# Patient Record
Sex: Female | Born: 1995
Health system: Southern US, Community
[De-identification: ages and names within clinical notes are randomized; demographics above are authoritative.]

## PROBLEM LIST (undated history)

## (undated) ENCOUNTER — Ambulatory Visit: Payer: 59

## (undated) DIAGNOSIS — J45909 Unspecified asthma, uncomplicated: Secondary | ICD-10-CM

## (undated) DIAGNOSIS — R413 Other amnesia: Secondary | ICD-10-CM

## (undated) DIAGNOSIS — R569 Unspecified convulsions: Secondary | ICD-10-CM

## (undated) DIAGNOSIS — I609 Nontraumatic subarachnoid hemorrhage, unspecified: Secondary | ICD-10-CM

## (undated) DIAGNOSIS — I639 Cerebral infarction, unspecified: Secondary | ICD-10-CM

## (undated) DIAGNOSIS — H479 Unspecified disorder of visual pathways: Secondary | ICD-10-CM

## (undated) DIAGNOSIS — L309 Dermatitis, unspecified: Secondary | ICD-10-CM

## (undated) DIAGNOSIS — T7840XA Allergy, unspecified, initial encounter: Secondary | ICD-10-CM

## (undated) HISTORY — PX: STRABISMUS SURGERY: SHX218

## (undated) HISTORY — DX: Allergy, unspecified, initial encounter: T78.40XA

## (undated) HISTORY — PX: EYE SURGERY: SHX253

## (undated) HISTORY — DX: Dermatitis, unspecified: L30.9

## (undated) HISTORY — DX: Other amnesia: R41.3

## (undated) HISTORY — DX: Unspecified asthma, uncomplicated: J45.909

---
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
## 2017-04-10 DIAGNOSIS — H5213 Myopia, bilateral: Secondary | ICD-10-CM | POA: Diagnosis not present

## 2017-04-10 DIAGNOSIS — H538 Other visual disturbances: Secondary | ICD-10-CM | POA: Diagnosis not present

## 2017-10-10 ENCOUNTER — Other Ambulatory Visit: Payer: Self-pay

## 2017-10-10 MED ORDER — LEVOCETIRIZINE DIHYDROCHLORIDE 5 MG PO TABS: 5.0000 mg | ORAL_TABLET | Freq: Every evening | ORAL | 0 refills | Status: DC

## 2017-10-10 NOTE — Telephone Encounter (Signed)
Rx for Xyzal 5 mg sent into pharmacy. Patient needs to schedule an appointment to see PCP for further refills

## 2017-10-13 ENCOUNTER — Telehealth: Payer: Self-pay | Admitting: Family Medicine

## 2017-10-13 NOTE — Telephone Encounter (Signed)
15 days was sent into the pharmacy on 10/10/2017, patient's appointment is within this time frame further refills will be addressed at the time of appointment. MPulliam, CMA/RT(R)

## 2017-10-13 NOTE — Telephone Encounter (Signed)
Patient called pharmacy for refill but was told OV required before any refills granted--scheduled pt for 1/4 @ 10:30.  Pt request refill on: levocetirizine (XYZAL) 5 MG tablet [[id number]][[id number]]  Order Details  Dose: 5 mg Route: Oral Frequency: Every evening  Note to Pharmacy:  Needs OV for further refills      Dispense Quantity: 15 tablet Refills: 0 Fills remaining: --        Sig: Take 1 tablet (5 mg total) by mouth every evening.     Patient uses: [facility] [place] - [id number] [address] DEA #:     --[id number]

## 2017-10-17 ENCOUNTER — Ambulatory Visit: Payer: 59 | Admitting: Family Medicine

## 2017-10-17 ENCOUNTER — Encounter: Payer: Self-pay | Admitting: Family Medicine

## 2017-10-17 VITALS — BP 127/88 | HR 97 | Ht 62.5 in | Wt 98.9 lb

## 2017-10-17 DIAGNOSIS — N946 Dysmenorrhea, unspecified: Secondary | ICD-10-CM | POA: Insufficient documentation

## 2017-10-17 DIAGNOSIS — J3089 Other allergic rhinitis: Secondary | ICD-10-CM

## 2017-10-17 DIAGNOSIS — Z833 Family history of diabetes mellitus: Secondary | ICD-10-CM | POA: Diagnosis not present

## 2017-10-17 DIAGNOSIS — J45909 Unspecified asthma, uncomplicated: Secondary | ICD-10-CM | POA: Insufficient documentation

## 2017-10-17 DIAGNOSIS — N926 Irregular menstruation, unspecified: Secondary | ICD-10-CM | POA: Insufficient documentation

## 2017-10-17 MED ORDER — FLUTICASONE PROPIONATE 50 MCG/ACT NA SUSP: 1.0000 | Freq: Every day | NASAL | 11 refills | Status: DC

## 2017-10-17 MED ORDER — BUDESONIDE 180 MCG/ACT IN AEPB: 1.0000 | INHALATION_SPRAY | Freq: Every day | RESPIRATORY_TRACT | 11 refills | Status: DC

## 2017-10-17 MED ORDER — LEVOCETIRIZINE DIHYDROCHLORIDE 5 MG PO TABS: 5.0000 mg | ORAL_TABLET | Freq: Every evening | ORAL | 11 refills | Status: DC

## 2017-10-17 NOTE — Progress Notes (Signed)
Impression and Recommendations:    1.  Seasonal and environmental allergies   2. Reactive airway disease without complication, unspecified asthma severity, unspecified whether persistent   3. Dysmenorrhea in adolescent   4. Family history of diabetes mellitus (DM)     1. Allergies/Sinusitis - Recommended use of neti-pot, Neilmed, or AYR sinus rinse using distilled water, followed by Flonase, one spray per each nostril. Repeat sinus rinse twice daily.  - Patient was advised to flush sinuses immediately after exposure to known allergen triggers.  - Refill pulmicort, flonase, xyzal.  -Patient denies need for rescue inhaler.  Denies any symptoms.  Stable.  2. Dysmenorrhea - Patient was advised to continue following up with OBGYN provider for evaluation of her dysmenorrhea. - She obtains her birth control pill prescription from her OBGYN.  The importance of routine follow up examination was emphasized and discussed with the patient.  She should come in within 6 months for fasting labs and a complete physical.   Meds ordered this encounter  Medications  . budesonide (PULMICORT) 180 MCG/ACT inhaler    Sig: Inhale 1 puff into the lungs daily.    Dispense:  1 Inhaler    Refill:  11  . fluticasone (FLONASE) 50 MCG/ACT nasal spray    Sig: Place 1 spray into both nostrils daily.    Dispense:  16 g    Refill:  11  . levocetirizine (XYZAL) 5 MG tablet    Sig: Take 1 tablet (5 mg total) by mouth every evening.    Dispense:  30 tablet    Refill:  11    Needs OV for further refills    Gross side effects, risk and benefits, and alternatives of medications and treatment plan in general discussed with patient.  Patient is aware that all medications have potential side effects and we are unable to predict every side effect or drug-drug interaction that may occur.   Patient will call with any questions prior to using medication if they have concerns.  Expresses verbal understanding  and consents to current therapy and treatment regimen.  No barriers to understanding were identified.  Red flag symptoms and signs discussed in detail.  Patient expressed understanding regarding what to do in case of emergency\urgent symptoms  Please see AVS handed out to patient at the end of our visit for further patient instructions/ counseling done pertaining to today's office visit.   Return for 82mo for physical and fasting bldwrk; sooner as needed.    Note: This note was prepared with assistance of Dragon voice recognition software. Occasional wrong-word or sound-a-like substitutions may have occurred due to the inherent limitations of voice recognition software.   This document serves as a record of services personally performed by Thomasene Lot, DO. It was created on her behalf by Peggye Fothergill, a trained medical scribe. The creation of this record is based on the scribe's personal observations and the provider's statements to them.   I have reviewed medical documentation per the transcriptionist for accuracy and concur.  Thomasene Lot 10/17/17 11:10 AM   --------------------------------------------------------------------------------------------------------------------------------------------------------------------------------------------------------------------------------------------    Subjective:     HPI: Kathleen Myers is a 22 y.o. female who presents to [facility] at [facility] today for issues as discussed below.  She returns today unaccompanied. She is still working as a Art gallery manager and attending classes at [employer], and has aspirations to continue working in daycare.  Allergy Medication Evaluation - She is here today to  evaluate her current prescriptions. She is overdue for her routine physical examination.  - Her allergies have been fine. She is still taking Pulmicort. With her allergies, she has good days and bad  days, sometimes even while taking her medicine. She notes that these bad days are worse whenever the weather changes. She denies issues with breathing, SOB, SOB on exertion, and does not need a rescue inhaler at this time.  Dysmenorrhea - She takes pain medicine PRN for pain during her menstrual cycle.  These are given to her by her GYN  - She is currently on a birth control pill prescribed by her OBGYN provider.  Family Hx - She notes that her grandmother has diabetes. For the rest of her family history, she is unaware, but will check on this.  - She does not recall having labs drawn in the past.   Wt Readings from Last 3 Encounters:  10/17/17 98 lb 14.4 oz (44.9 kg) (3 %, Z= -1.92)*  09/18/16 98 lb 12.8 oz (44.8 kg) (3 %, Z= -1.86)*  02/12/16 100 lb (45.4 kg) (5 %, Z= -1.67)*   * Growth percentiles are based on CDC (Girls, 2-20 Years) data.   BP Readings from Last 3 Encounters:  10/17/17 127/88 (93 %, Z = 1.48 /  >99 %, Z > 2.33)*  09/18/16 124/88 (89 %, Z = 1.22 /  >99 %, Z > 2.33)*  02/12/16 111/73 (54 %, Z = 0.09 /  81 %, Z = 0.87)*   *BP percentiles are based on the August 2017 AAP Clinical Practice Guideline for girls   Pulse Readings from Last 3 Encounters:  10/17/17 97  09/18/16 95  02/12/16 (!) 58   BMI Readings from Last 3 Encounters:  10/17/17 17.80 kg/m (5 %, Z= -1.66)*  09/18/16 17.78 kg/m (5 %, Z= -1.61)*  02/12/16 18.14 kg/m (9 %, Z= -1.33)*   * Growth percentiles are based on CDC (Girls, 2-20 Years) data.     Patient Care Team    Relationship Specialty Notifications Start End  Thomasene Lotpalski, Ziana Heyliger, DO PCP - General Family Medicine  09/18/16   Eldridge AbrahamsPizoli, Carolyn Elizabeth, MD Referring Physician Pediatric Neurology  09/18/16    Comment: 6 yrs ago  Eileen StanfordWhelan, Meg, MD Referring Physician Allergy and Immunology  09/18/16   Key, Verita SchneidersEvelyn M, NP Nurse Practitioner Gynecology  09/18/16   Verne CarrowYoung, William, MD Consulting Physician Ophthalmology  09/18/16    Comment: still  actively seeing him     Patient Active Problem List   Diagnosis Date Noted  . Dysmenorrhea in adolescent 10/17/2017    Priority: High  . Family history of diabetes mellitus (DM) 10/17/2017    Priority: High  . Oral contraceptive use- seen by GYN Darlyn Chamber- Evelyn Key, NP 10/08/2016    Priority: High  .  Seasonal and environmental allergies-txed by Dr Barnetta ChapelWhelan 09/18/2016    Priority: High  . Borderline intellectual disability 12/05/2011    Priority: High  . 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 Seizure (HCC) 09/18/2016    Priority: Medium  . h/o Stroke secondary to vomiting episodes per Neurologist 09/18/2016    Priority: Medium  . h/o SAH (subarachnoid hemorrhage) at birth 09/18/2016    Priority: Medium  . Poor short term memory 09/18/2016    Priority: Medium  . Abnormal brain MRI 12/05/2011    Priority: Low  . Irregular periods 10/17/2017  . Prolonged periods 10/17/2017  . Reactive airway disease without complication 10/17/2017  .  Upper respiratory symptom 09/18/2016  . Neonatal seizures 12/05/2011  . Strabismus 12/05/2011    Past Medical history, Surgical history, Family history, Social history, Allergies and Medications have been entered into the medical record, reviewed and changed as needed.    Current Meds  Medication Sig  . budesonide (PULMICORT) 180 MCG/ACT inhaler Inhale 1 puff into the lungs daily.  Marland Kitchen desogestrel-ethinyl estradiol (APRI,EMOQUETTE,SOLIA) 0.15-30 MG-MCG tablet Take 1 tablet by mouth daily.  . fluticasone (FLONASE) 50 MCG/ACT nasal spray Place 1 spray into both nostrils daily.  Marland Kitchen levocetirizine (XYZAL) 5 MG tablet Take 1 tablet (5 mg total) by mouth every evening.  . [DISCONTINUED] budesonide (PULMICORT) 180 MCG/ACT inhaler Inhale 1 puff into the lungs daily.  . [DISCONTINUED] fluticasone (FLONASE) 50 MCG/ACT nasal spray Place 1 spray into both nostrils daily.  . [DISCONTINUED] levocetirizine (XYZAL) 5 MG tablet Take 1 tablet  (5 mg total) by mouth every evening.  . [DISCONTINUED] norethindrone-ethinyl estradiol (JUNEL FE,GILDESS FE,LOESTRIN FE) 1-20 MG-MCG tablet Take 1 tablet by mouth every evening.    Allergies:  Allergies  Allergen Reactions  . Other Other (See Comments)    anaphalaxis with peanuts  . Peanut-Containing Drug Products Anaphylaxis  . Celery Oil Other (See Comments)    Review of Systems:  A fourteen system review of systems was performed and found to be positive as per HPI.   Objective:   Blood pressure 127/88, pulse 97, height 5' 2.5" (1.588 m), weight 98 lb 14.4 oz (44.9 kg), last menstrual period 10/03/2017, SpO2 100 %. Body mass index is 17.8 kg/m. General:  Well Developed, well nourished, appropriate for stated age.  Neuro:  Alert and oriented,  extra-ocular muscles intact  HEENT:  Normocephalic, atraumatic, neck supple, no carotid bruits appreciated  Skin:  no gross rash, warm, pink. Cardiac:  RRR, S1 S2 Respiratory:  ECTA B/L and A/P, Not using accessory muscles, speaking in full sentences- unlabored. Vascular:  Ext warm, no cyanosis apprec.; cap RF less 2 sec. Psych:  No HI/SI, judgement and insight good, Euthymic mood. Full Affect.

## 2017-10-17 NOTE — Patient Instructions (Addendum)
In addition to your allergy medicines I recommend sinus rinses twice daily such as Neomed or AYR sinus rinses.  Please make sure you use previously boiled or distilled water.  Do sinus rinses twice daily followed by 1 spray 1 spray Flonase each nostril.  Please see the video below on how to use.  And also make sure that you do it after any prolonged exposure to the elements that you know tend to aggravate your allergies  [email or web address]      Preventive Care for Adults  A healthy lifestyle and preventive care can promote health and wellness. Preventive health guidelines for men include the following key practices:  .   A routine yearly physical is a good way to check with your health care provider about your health and preventative screening. It is a chance to share any concerns and updates on your health and to receive a thorough exam.  .  Visit your dentist for a routine exam and preventative care every 6 months. Brush your teeth twice a day and floss once a day. Good oral hygiene prevents tooth decay and gum disease.  .  The frequency of eye exams is based on your age, health, family medical history, use of contact lenses, and other factors.  Follow your health care provider's recommendations for frequency of eye exams.  .  Eat a healthy diet.  Foods such as vegetables, fruits, whole grains, low-fat dairy products, and lean protein foods contain the nutrients you need without too many calories.  Decrease your intake of foods high in solid fats, added sugars, and salt.  Eat the right amount of calories for you.  Get information about a proper diet from your health care provider, if necessary.  .  Regular physical exercise is one of the most important things you can do for your health.  Most adults should get at least 150 minutes of moderate-intensity exercise (any activity that increases your heart rate and causes you to sweat) each week.  In addition, most  adults need muscle-strengthening exercises on 2 or more days a week.  .  Maintain a healthy weight. The body mass index (BMI) is a screening tool to identify possible weight problems. It provides an estimate of body fat based on height and weight. Your health care provider can find your BMI and can help you achieve or maintain a healthy weight. For adults 20 years and older: A BMI below 18.5 is considered underweight. A BMI of 18.5 to 24.9 is normal. A BMI of 25 to 29.9 is considered overweight. A BMI of 30 and above is considered obese.  .  Maintain normal blood lipids and cholesterol levels by exercising and minimizing your intake of saturated fat. Eat a balanced diet with plenty of fruit and vegetables. Blood tests for lipids and cholesterol should begin at age 58 and be repeated every 5 years. If your lipid or cholesterol levels are high, you are over 50, or you are at high risk for heart disease, you may need your cholesterol levels checked more frequently. Ongoing high lipid and cholesterol levels should be treated with medicines if diet and exercise are not working.  .  If you smoke, find out from your health care provider how to quit. If you do not use tobacco, do not start.  . If you choose to drink alcohol, do not have more than 2 drinks per day. One drink is considered to be 12 ounces (355 mL) of beer, 5 ounces (148 mL) of  wine, or 1.5 ounces (44 mL) of liquor.  Marland Kitchen. Avoid use of street drugs. Do not share needles with anyone. Ask for help if you need support or instructions about stopping the use of drugs.  . High blood pressure causes heart disease and increases the risk of stroke. Your blood pressure should be checked at least every 1-2 years. Ongoing high blood pressure should be treated with medicines, if weight loss and exercise are not effective.  . If you are 4645-20 years old, ask your health care provider if you should take aspirin to prevent heart disease.  . Diabetes  screening involves taking a blood sample to check your fasting blood sugar level.  This should be done once every 3 years, after age 20, if you are within normal weight and without risk factors for diabetes.  Testing should be considered at a younger age or be carried out more frequently if you are overweight and have at least 1 risk factor for diabetes.  . Colorectal cancer can be detected and often prevented. Most routine colorectal cancer screening begins at the age of 20 and continues through age 20. However, your health care provider may recommend screening at an earlier age if you have risk factors for colon cancer. On a yearly basis, your health care provider may provide home test kits to check for hidden blood in the stool. Use of a small camera at the end of a tube to directly examine the colon (sigmoidoscopy or colonoscopy) can detect the earliest forms of colorectal cancer. Talk to your health care provider about this at age 20, when routine screening begins. Direct exam of the colon should be repeated every 5-10 years through age 20, unless early forms of precancerous polyps or small growths are found.  .  Lung cancer screening is recommended for adults aged 55-80 years who are at high risk for developing lung cancer because of a history of smoking. A yearly low-dose CT scan of the lungs is recommended for people who have at least a 30-pack-year history of smoking and are a current smoker or have quit within the past 15 years. A pack year of smoking is smoking an average of 1 pack of cigarettes a day for 1 year (for example: 1 pack a day for 30 years or 2 packs a day for 15 years). Yearly screening should continue until the smoker has stopped smoking for at least 15 years. Yearly screening should be stopped for people who develop a health problem that would prevent them from having lung cancer treatment.  . Talk with your health care provider about prostate cancer screening.  . Testicular  cancer screening is recommended for adult males. Screening includes self-exam and a health care provider exam. Consult with your health care provider about any symptoms you have or any concerns you have about testicular cancer.  . Use sunscreen. Apply sunscreen liberally and repeatedly throughout the day. You should seek shade when your shadow is shorter than you. Protect yourself by wearing long sleeves, pants, a wide-brimmed hat, and sunglasses year round, whenever you are outdoors.  . Once a month, do a whole-body skin exam, using a mirror to look at the skin on your back. Tell your health care provider about new moles, moles that have irregular borders, moles that are larger than a pencil eraser, or moles that have changed in shape or color.    ++++++++++++++++++++++++++++++++++++++++++++++++++++++++++++++++++  Stay current with required vaccines (immunizations).  ? Influenza vaccine. All adults should be immunized  every year.  ? Tetanus, diphtheria, and acellular pertussis (Td, Tdap) vaccine. An adult who has not previously received Tdap or who does not know his vaccine status should receive 1 dose of Tdap. This initial dose should be followed by tetanus and diphtheria toxoids (Td) booster doses every 10 years. Adults with an unknown or incomplete history of completing a 3-dose immunization series with Td-containing vaccines should begin or complete a primary immunization series including a Tdap dose. Adults should receive a Td booster every 10 years.  ? Varicella vaccine. An adult without evidence of immunity to varicella should receive 2 doses or a second dose if he has previously received 1 dose.  ? Human papillomavirus (HPV) vaccine. Males aged 13-21 years who have not received the vaccine previously should receive the 3-dose series. Males aged 22-26 years may be immunized. Immunization is recommended through the age of 59 years for any female who has sex with males and did not get any  or all doses earlier. Immunization is recommended for any person with an immunocompromised condition through the age of 26 years if he did not get any or all doses earlier. During the 3-dose series, the second dose should be obtained 4-8 weeks after the first dose. The third dose should be obtained 24 weeks after the first dose and 16 weeks after the second dose.  ? Zoster vaccine. One dose is recommended for adults aged 62 years or older unless certain conditions are present.   ? PREVNAR - Pneumococcal 13-valent conjugate (PCV13) vaccine. When indicated, a person who is uncertain of his immunization history and has no record of immunization should receive the PCV13 vaccine. An adult aged 74 years or older who has certain medical conditions and has not been previously immunized should receive 1 dose of PCV13 vaccine. This PCV13 should be followed with a dose of pneumococcal polysaccharide (PPSV23) vaccine. The PPSV23 vaccine dose should be obtained at least 8 weeks after the dose of PCV13 vaccine. An adult aged 29 years or older who has certain medical conditions and previously received 1 or more doses of PPSV23 vaccine should receive 1 dose of PCV13. The PCV13 vaccine dose should be obtained 1 or more years after the last PPSV23 vaccine dose.   ? PNEUMOVAX - Pneumococcal polysaccharide (PPSV23) vaccine. When PCV13 is also indicated, PCV13 should be obtained first. All adults aged 29 years and older should be immunized. An adult younger than age 52 years who has certain medical conditions should be immunized. Any person who resides in a nursing home or long-term care facility should be immunized. An adult smoker should be immunized. People with an immunocompromised condition and certain other conditions should receive both PCV13 and PPSV23 vaccines. People with human immunodeficiency virus (HIV) infection should be immunized as soon as possible after diagnosis. Immunization during chemotherapy or  radiation therapy should be avoided. Routine use of PPSV23 vaccine is not recommended for American Indians, [address], or people younger than 65 years unless there are medical conditions that require PPSV23 vaccine. When indicated, people who have unknown immunization and have no record of immunization should receive PPSV23 vaccine. One-time revaccination 5 years after the first dose of PPSV23 is recommended for people aged 19-64 years who have chronic kidney failure, nephrotic syndrome, asplenia, or immunocompromised conditions. People who received 1-2 doses of PPSV23 before age 59 years should receive another dose of PPSV23 vaccine at age 83 years or later if at least 5 years have passed since the previous dose. Doses  of PPSV23 are not needed for people immunized with PPSV23 at or after age 61 years.   ? Hepatitis A vaccine. Adults who wish to be protected from this disease, have certain high-risk conditions, work with hepatitis A-infected animals, work in hepatitis A research labs, or travel to or work in countries with a high rate of hepatitis A should be immunized. Adults who were previously unvaccinated and who anticipate close contact with an international adoptee during the first 60 days after arrival in the [place] from a country with a high rate of hepatitis A should be immunized.  ? Hepatitis B vaccine. Adults should be immunized if they wish to be protected from this disease, have certain high-risk conditions, may be exposed to blood or other infectious body fluids, are household contacts or sex partners of hepatitis B positive people, are clients or workers in certain care facilities, or travel to or work in countries with a high rate of hepatitis B.     Preventive Service / Frequency  . Ages 41 to 11  Blood pressure check.  Lipid and cholesterol check  Lung cancer screening. / Every year if you are aged 55-80 years and have a 30-pack-year history of smoking and currently  smoke or have quit within the past 15 years. Yearly screening is stopped once you have quit smoking for at least 15 years or develop a health problem that would prevent you from having lung cancer treatment.  Fecal occult blood test (FOBT) of stool. / Every year beginning at age 60 and continuing until age 61. You may not have to do this test if you get a colonoscopy every 10 years.  Flexible sigmoidoscopy** or colonoscopy.** / Every 5 years for a flexible sigmoidoscopy or every 10 years for a colonoscopy beginning at age 41 and continuing until age 31. Screening for abdominal aortic aneurysm (AAA) by ultrasound is recommended for people who have history of high blood pressure or who are current or former smokers.  ++++++++++++++++++++++++++++++++++++++++++++++++++++++++++++++  Recommend Adult Low Dose Aspirin or coated Aspirin 81 mg daily To reduce risk of Colon Cancer 20 % Skin Cancer 26 %  Melanoma 46% and Pancreatic cancer 60%  +++++++++++++++++++++++++++++++++++++++++++++++++++++++++++++  Vitamin D goal is between 50-100. Please make sure that you are taking your Vitamin D as directed.  It is very important as a natural anti-inflammatory - helping with muscle and joint aches; as well as helping hair, skin, and nails; as well as reducing stroke, heart attack and cancer risk. It helps your bones and helps with mood. It also decreases numerous cancer risks so please take it as directed.  - Low Vit D is associated with a 200-300% higher risk for CANCER and 200-300% higher risk for HEART ATTACK & STROKE.  It is also associated with higher death rate at younger ages, autoimmune diseases like Rheumatoid arthritis, Lupus, Multiple Sclerosis; also many other serious conditions, like depression, Alzheimer's Dementia, infertility, muscle aches, fatigue, fibromyalgia - just to name a few.  +++++++++++++++++++++++++++++++++++++++++++++++++++++++++++  Recommend the book "The END of DIETING" by  Dr Monico Hoar & the book "The END of DIABETES " by Dr Monico Hoar At [email or web address] - get book & Audio CD's   --->Being diabetic has a 300% increased risk for heart attack, stroke, cancer, and alzheimer- type vascular dementia. It is very important that you work harder with diet by avoiding all foods that are white. Avoid white rice (brown & wild rice is OK), white potatoes (sweet potatoes in moderation is OK), White  bread or wheat bread or anything made out of white flour like bagels, donuts, rolls, buns, biscuits, cakes, pastries, cookies, pizza crust, and pasta (made from white flour & egg whites) - vegetarian pasta or spinach or wheat pasta is OK. Multigrain breads like Arnold's or [employer], or multigrain sandwich thins or flatbreads. Diet, exercise and weight loss can reverse and cure diabetes in the early stages. Diet, exercise and weight loss is very important in the control and prevention of complications of diabetes which affects every system in your body, ie. Brain - dementia/stroke, eyes - glaucoma/blindness, heart - heart attack/heart failure, kidneys - dialysis, stomach - gastric paralysis, intestines - malabsorption, nerves - severe painful neuritis, circulation - gangrene & loss of a leg(s), and finally cancer and Alzheimers.  I recommend avoid fried & greasy foods, sweets/candy, white rice (brown or wild rice or Quinoa is OK), white potatoes (sweet potatoes are OK) - anything made from white flour - bagels, doughnuts, rolls, buns, biscuits,white and wheat breads, pizza crust and traditional pasta made of white flour & egg white(vegetarian pasta or spinach or wheat pasta is OK). Multi-grain bread is OK - like multi-grain flat bread or sandwich thins. Avoid alcohol in excess.  Exercise is also important. Eat all the vegetables you want - avoid fatty meats, especially red meat and dairy - especially cheese. Cheese is the most concentrated form of trans-fats which is the worst thing to clog  up our arteries. Veggie cheese is OK which can be found in the fresh produce section at [employer] or [employer] or [employer].  ++++++++++++++++++++++ DASH Eating Plan  DASH stands for "Dietary Approaches to Stop Hypertension."  The DASH eating plan is a healthy eating plan that has been shown to reduce high blood pressure (hypertension). Additional health benefits may include reducing the risk of type 2 diabetes mellitus, heart disease, and stroke. The DASH eating plan may also help with weight loss.  WHAT DO I NEED TO KNOW ABOUT THE DASH EATING PLAN? For the DASH eating plan, you will follow these general guidelines: . Choose foods with a percent daily value for sodium of less than 5% (as listed on the food label). . Use salt-free seasonings or herbs instead of table salt or sea salt. . Check with your health care provider or pharmacist before using salt substitutes. . Eat lower-sodium products, often labeled as "lower sodium" or "no salt added." . Eat fresh foods. . Eat more vegetables, fruits, and low-fat dairy products. . Choose whole grains. Look for the word "whole" as the first word in the ingredient list. . Choose fish . Limit sweets, desserts, sugars, and sugary drinks. . Choose heart-healthy fats. . Eat veggie cheese . Eat more home-cooked food and less restaurant, buffet, and fast food. . Limit fried foods. Adriana Simas foods using methods other than frying. . Limit canned vegetables. If you do use them, rinse them well to decrease the sodium. . When eating at a restaurant, ask that your food be prepared with less salt, or no salt if possible.   WHAT FOODS CAN I EAT? Read Dr Francis Dowse Fuhrman's books on The End of Dieting & The End of Diabetes  Grains Whole grain or whole wheat bread. Brown rice. Whole grain or whole wheat pasta. Quinoa, bulgur, and whole grain cereals. Low-sodium cereals. Corn or whole wheat flour tortillas. Whole grain cornbread. Whole grain  crackers. Low-sodium crackers.  Vegetables Fresh or frozen vegetables (raw, steamed, roasted, or grilled). Low-sodium or reduced-sodium tomato and vegetable  juices. Low-sodium or reduced-sodium tomato sauce and paste. Low-sodium or reduced-sodium canned vegetables.  Fruits All fresh, canned (in natural juice), or frozen fruits.  Protein Products All fish and seafood. Dried beans, peas, or lentils. Unsalted nuts and seeds. Unsalted canned beans.  Dairy Low-fat dairy products, such as skim or 1% milk, 2% or reduced-fat cheeses, low-fat ricotta or cottage cheese, or plain low-fat yogurt. Low-sodium or reduced-sodium cheeses.  Fats and Oils Tub margarines without trans fats. Light or reduced-fat mayonnaise and salad dressings (reduced sodium). Avocado. Safflower, olive, or canola oils. Natural peanut or almond butter.  Other Unsalted popcorn and pretzels. The items listed above may not be a complete list of recommended foods or beverages. Contact your dietitian for more options.  ++++++++++++++++++++++++++++++++++++++++++++++++++++++++++++++++  WHAT FOODS ARE NOT RECOMMENDED?  Grains/ White flour or wheat flour White bread. White pasta. White rice. Refined cornbread. Bagels and croissants. Crackers that contain trans fat. Vegetables Creamed or fried vegetables. Vegetables in a . Regular canned vegetables. Regular canned tomato sauce and paste. Regular tomato and vegetable juices. Fruits Dried fruits. Canned fruit in light or heavy syrup. Fruit juice. Meat and Other Protein Products Meat in general - RED meat & White meat. Fatty cuts of meat. Ribs, chicken wings, all processed meats as bacon, sausage, bologna, salami, fatback, hot dogs, bratwurst and packaged luncheon meats. Dairy Whole or 2% milk, cream, half-and-half, and cream cheese. Whole-fat or sweetened yogurt. Full-fat cheeses or blue cheese. Non-dairy creamers and whipped toppings. Processed cheese, cheese spreads, or cheese  curds.  Condiments Onion and garlic salt, seasoned salt, table salt, and sea salt. Canned and packaged gravies. Worcestershire sauce. Tartar sauce. Barbecue sauce. Teriyaki sauce. Soy sauce, including reduced sodium. Steak sauce. Fish sauce. Oyster sauce. Cocktail sauce. Horseradish. Ketchup and mustard. Meat flavorings and tenderizers. Bouillon cubes. Hot sauce. Tabasco sauce. Marinades. Taco seasonings. Relishes. Fats and Oils Butter, stick margarine, lard, shortening and bacon fat. Coconut, palm kernel, or palm oils. Regular salad dressings. Pickles and olives. Salted popcorn and pretzels. The items listed above may not be a complete list of foods and beverages to avoid.

## 2017-12-30 IMAGING — CT CT HEAD W/O CM
2 series · 16 of 30 positions shown, 20 images · non-contrast
Comparison: None.

CLINICAL DATA: Headache with altered mental status

EXAM:
CT HEAD WITHOUT CONTRAST
TECHNIQUE: Contiguous axial images were obtained from the base of the skull
through the vertex without intravenous contrast.

[Series 201: head w/o, idose (1) · axial · non-contrast · 0.40mm/px · z∈[+68,+198]mm · 13 of 32 slices shown, 17 images]
[im 3/32  brain]
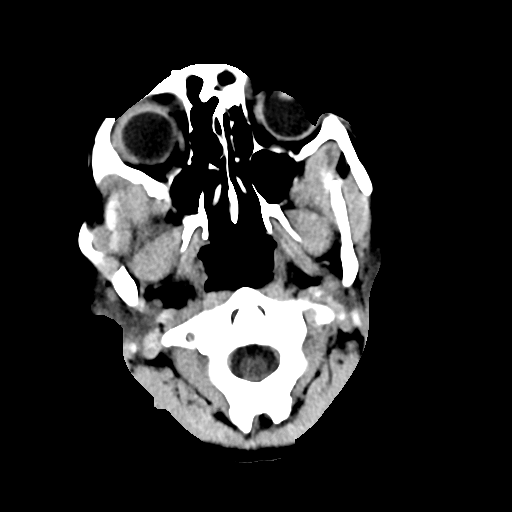
[im 3/32  bone]
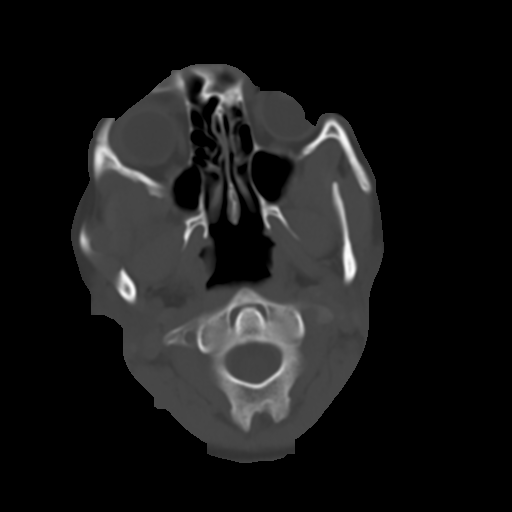
[im 5/32  brain]
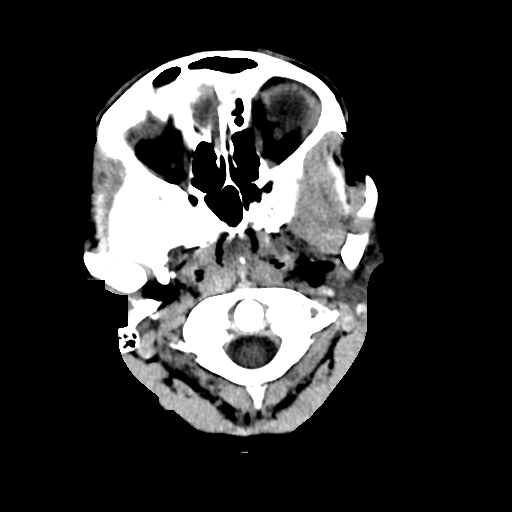
[im 7/32  brain]
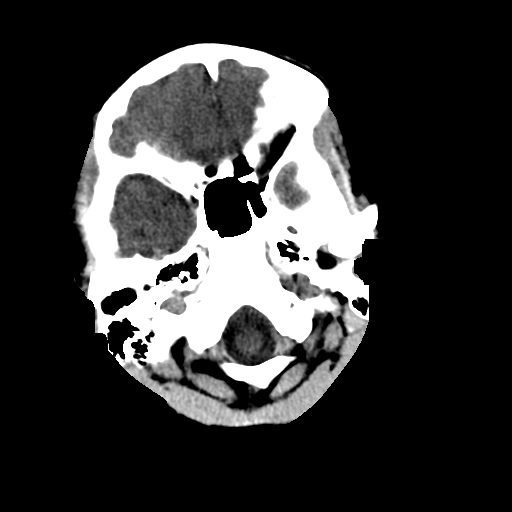
[im 9/32  brain]
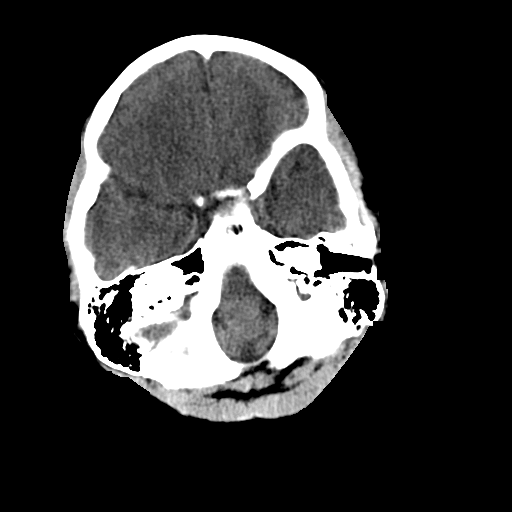
[im 12/32  brain]
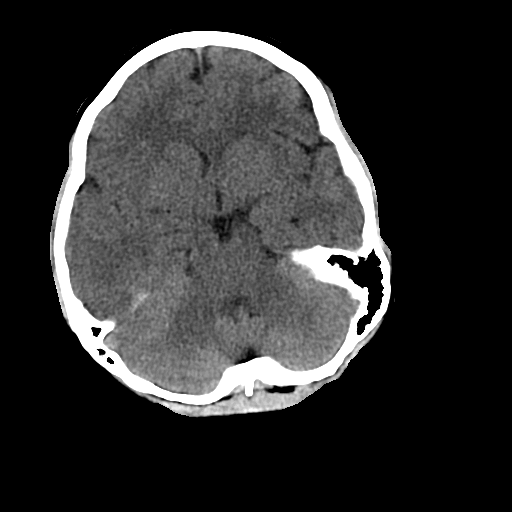
[im 12/32  bone]
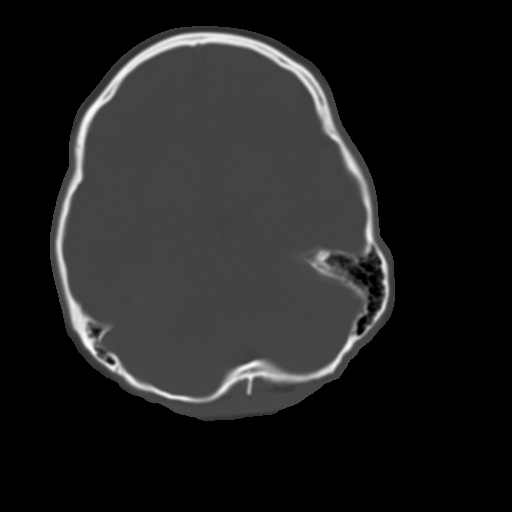
[im 14/32  brain]
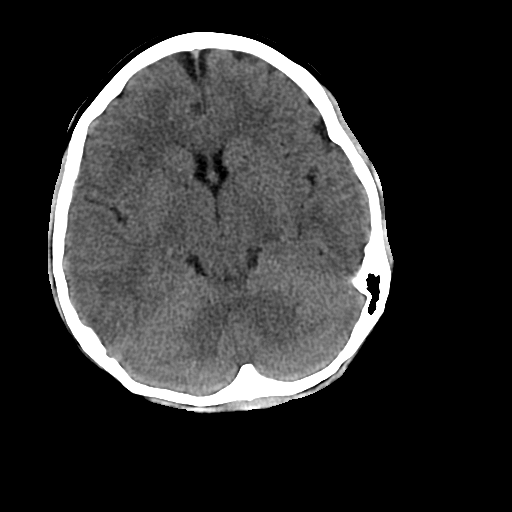
[im 16/32  brain]
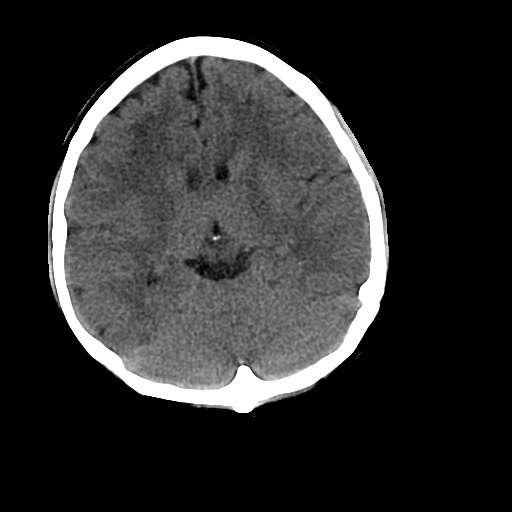
[im 18/32  brain]
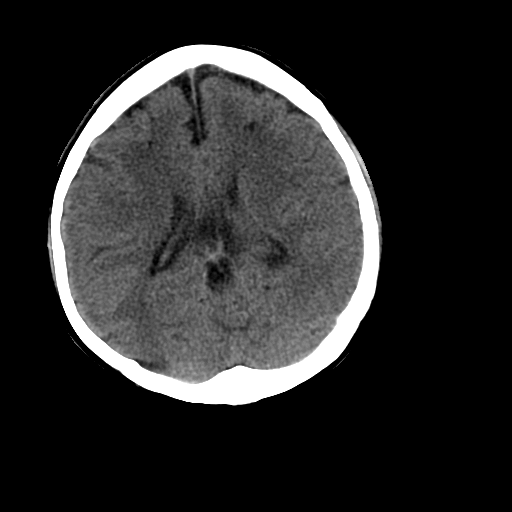
[im 20/32  brain]
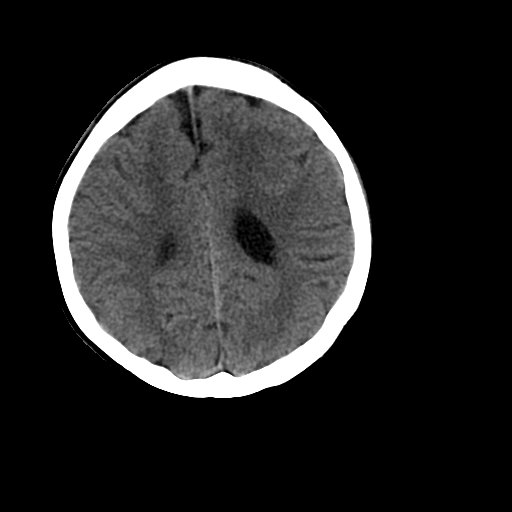
[im 20/32  bone]
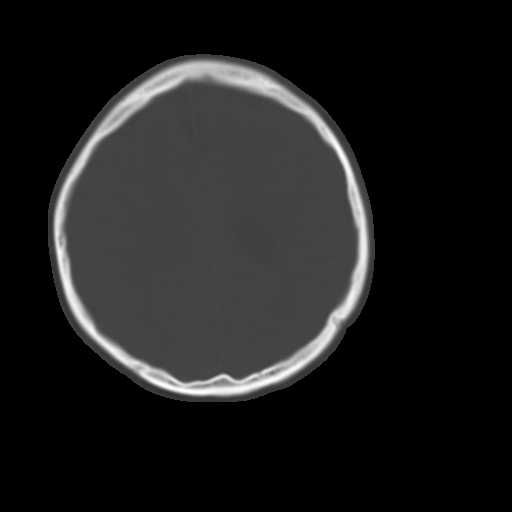
[im 23/32  brain]
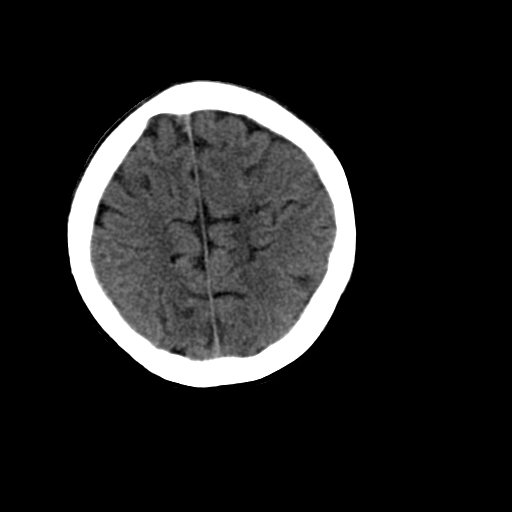
[im 25/32  brain]
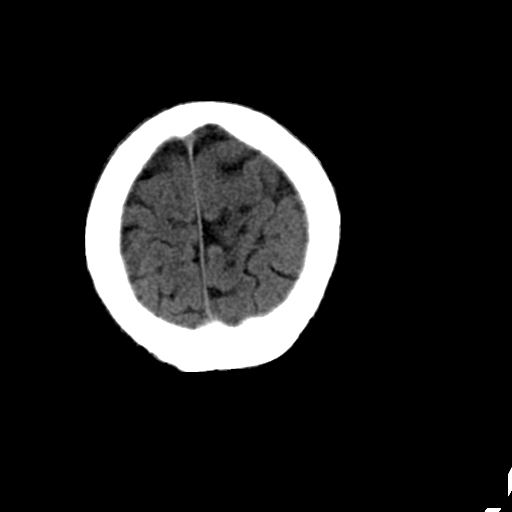
[im 27/32  brain]
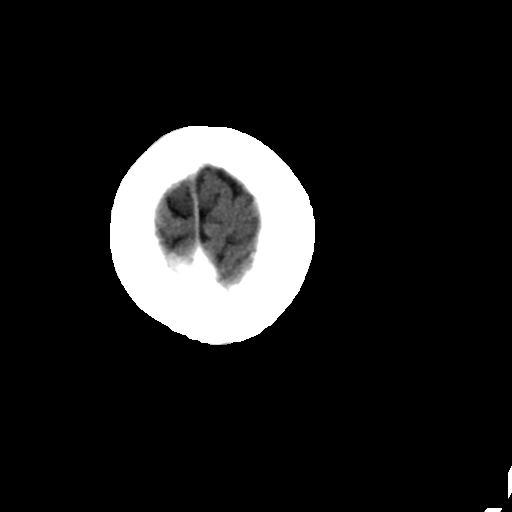
[im 29/32  brain]
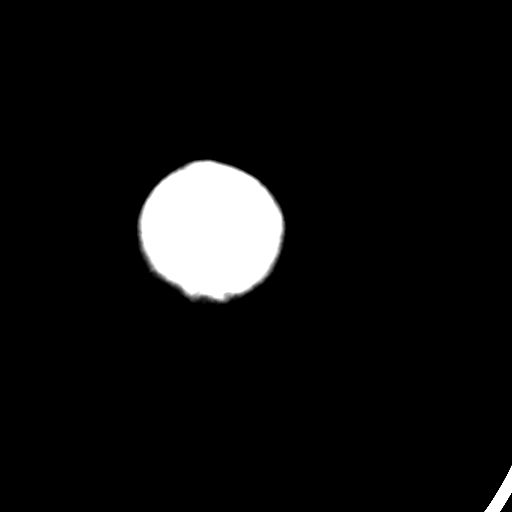
[im 29/32  bone]
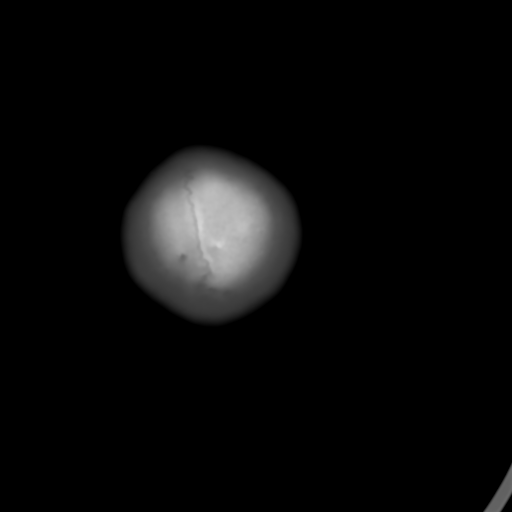

[Series 202: head w/o bone, idose (1) · axial · non-contrast · 0.40mm/px · z∈[+68,+113]mm · 3 of 32 slices shown]
[im 3/32  bone]
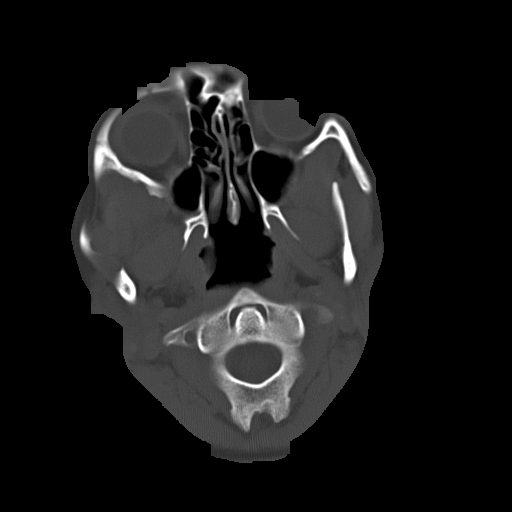
[im 7/32  bone]
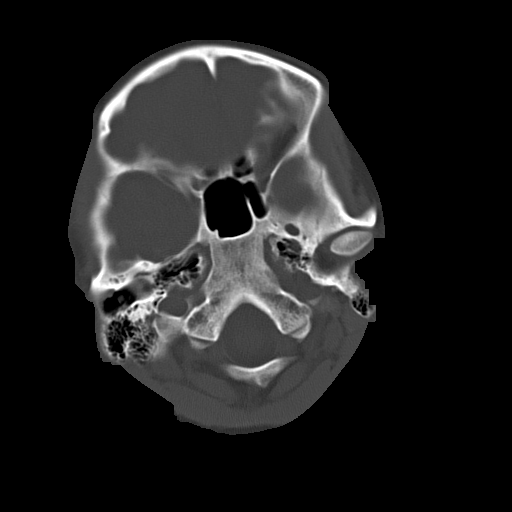
[im 12/32  bone]
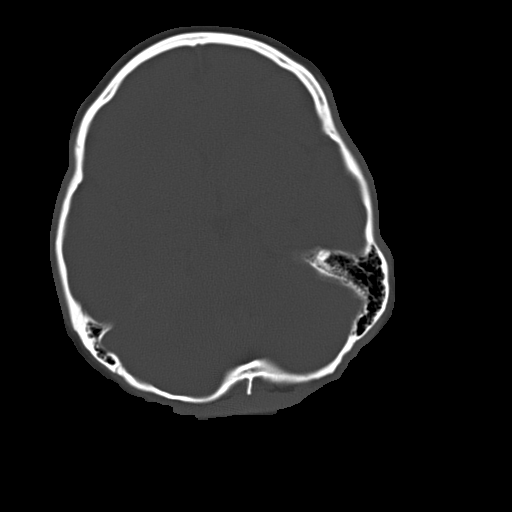

[16 of 30 positions shown; findings below may reference images not displayed]

FINDINGS: The ventricles are normal in size and configuration. There is no
intracranial mass, hemorrhage, extra-axial fluid collection, or
midline shift. The gray-white compartments appear normal. No acute
infarct evident. Bony calvarium appears intact. The mastoid air
cells are clear. No intraorbital lesions are apparent.
IMPRESSION: Study within normal limits.

## 2018-01-15 ENCOUNTER — Other Ambulatory Visit: Payer: Self-pay | Admitting: Adult Health

## 2018-01-15 ENCOUNTER — Telehealth: Payer: Self-pay | Admitting: Family Medicine

## 2018-01-15 DIAGNOSIS — R569 Unspecified convulsions: Secondary | ICD-10-CM

## 2018-01-15 NOTE — Telephone Encounter (Signed)
Referral placed! Thanks! Jezel Basto  

## 2018-01-15 NOTE — Telephone Encounter (Signed)
Called patient and spoke to her and her mother.  She states that she felt "werid" this morning; laid down but that did not help. Patient vomited this morning 1 time.  Arms shaking, left side more than the right.  Patient is alert and was talking to mother the whole time after calling her to come over.  Patient has ate now and has not vomited since.  Patient is alert and shaking has slowed down and almost stopped.  Patient denies any chest or body paines, denies fever, denies SOB.  Patient has history of seizures as a child and mother states this is similar to how they presented except this event did not include the rapid eye movement.  Patient has not had one in several years and has not seen neruology in 2-3 years.  Patient's will contact the office if any changes and will watch daughter and monitor any changes.  Patient would like a referral for neurology. MPulliam, CMA/RT(R)

## 2018-01-15 NOTE — Progress Notes (Signed)
Neurology referral placed

## 2018-01-15 NOTE — Telephone Encounter (Signed)
Patient's mother called on behalf of patient and is requesting a call about her daughter's shaking episode today. Please advise

## 2018-01-19 DIAGNOSIS — N920 Excessive and frequent menstruation with regular cycle: Secondary | ICD-10-CM | POA: Diagnosis not present

## 2018-01-19 DIAGNOSIS — N946 Dysmenorrhea, unspecified: Secondary | ICD-10-CM | POA: Diagnosis not present

## 2018-01-19 DIAGNOSIS — Z01419 Encounter for gynecological examination (general) (routine) without abnormal findings: Secondary | ICD-10-CM | POA: Diagnosis not present

## 2018-03-24 ENCOUNTER — Telehealth: Payer: Self-pay | Admitting: Neurology

## 2018-03-24 ENCOUNTER — Ambulatory Visit: Payer: 59 | Admitting: Neurology

## 2018-03-24 ENCOUNTER — Encounter: Payer: Self-pay | Admitting: Neurology

## 2018-03-24 VITALS — BP 118/69 | HR 82 | Ht 62.5 in | Wt 96.0 lb

## 2018-03-24 DIAGNOSIS — Z87898 Personal history of other specified conditions: Secondary | ICD-10-CM

## 2018-03-24 NOTE — Telephone Encounter (Signed)
lvm for pt to call back about scheduling mri  °[employer] Auth: [employer] via [employer] website  °

## 2018-03-24 NOTE — Progress Notes (Signed)
PATIENT: Kathleen Myers DOB: 18-Jun-1996  Chief Complaint  Patient presents with  . Seizures    She is here with her mother, Kathleen Myers.  Reports episodes of nausea, vomiting, right arm numbness and dizziness lasting two hours.  She did not lose consciousness.  Previously, she had a similar event in May 2017 where she was evaluated in ED.  Marland Kitchen PCP    Opalski, Kathleen Pound, DO     HISTORICAL  Kathleen Myers is a 22 years old female, accompanied by her mom, seen in refer by her primary care physician Dr. Thomasene Myers for evaluation of abnormal MRI of the brain history of seizure, initial evaluation was on March 24, 2018.  She had a forceps delivery, post delivery 5 hours, she was found to be unresponsive, later was diagnosed with subarachnoid hemorrhage, seizure, she was treated  with phenobarbital for about 1 month, there was no recurrent seizure afterwards, phenobarbital was tapered off,  Initially she was developmentally normal, at age [age] 1/2, had one episode at her daycare, she had emesis in her sleep, followed by right-sided weakness, lasting for 2 hours,  Another episode at age 56 in July 2004, she was found by her father had emesis increased sleep, difficulty to be awakening, followed by confusion, she was taken to [facility],  "I was also able to review the hospital admission at [facility] on May 01, 2003, on April 30, 2003 at 10 AM, father noted she was in sleeping emesis, was later noted to have weakness of right arm, left side deviation of both eyes, she had unsteady gait, confused, more episode of vomiting, she had a witnessed seizure at the emergency room, she was loaded with fosphenytoin, CT head was negative, on initial examinations, she has a full range of motion of all extremities, left foot 2-3 beats clonus, her mental status improved dramatically after the Ativan, and fosphenytoin was able to  wean off, she was back to baseline by the evening of July 17,  there was suggestion of seizure versus migraine headaches,  Laboratory evaluation April 30, 2003, showed normal CMP CBC hemoglobin of 13.6   MRI of the brain on August 12, 2003 performed at [facility] clinic showed evidence of bilateral cortical and subcortical wedge-shaped area of infarct involving the frontal and parietal occipital region with chronic changes and volume loss, the central sulcus is spared on both side, this findings could suggest either global hypoxic brain injury or possibly watershed infarction  MR angiogram on September 02, 2003 was essentially normal vessels, with exception of right vertebral artery is not adequately seen, other than a small narrow tapering at its distal end suggestive of possible retrograde feeding from the other side,  But since that event in 2004, when she was 22 years old, she was noted to have a change, she became slower to respond, when she started to screen, she has been struggling with her short-term memory, is especially difficult with her math, she also have staring spells, but no clinical seizure activity,  When the family moved back to [place], she was seen at [facility],   EEG at [facility] on Feb 14, 1999 07 showed frequent left hemispheric spike and wave discharge, maximum over left temporal region, intermittent focal slowing at the left temporal region, findings suggestive of a functional structural lesion involving the left temporal region, may be epileptogenic in nature,  MRI of the brain with without contrast on April 17, 2006 there is no acute  intracranial abnormality, there are areas of high T2 flair signal abnormality within the watershed region of bilateral frontal, bilateral parietal, bilateral occipital lobes,  She was never offered antiepileptic medication treatment, she rarely complains of headaches,  She is currently a Archivist at [employer], worked part-time job at daycare, drive independently for over a  year, mother wants about her slow reaction time, she was also followed by ophthalmologist, there was holes in her visual field  Patient denies significant difficulty,   REVIEW OF SYSTEMS: Full 14 system review of systems performed and notable only for blurred vision, feeling cold, allergy, runny nose, skin sensitivity, memory loss, confusion, dizziness, seizure, passing out, not enough  ALLERGIES: Allergies  Allergen Reactions  . Other Other (See Comments)    anaphalaxis with peanuts  . Peanut-Containing Drug Products Anaphylaxis  . Celery Oil Other (See Comments)    HOME MEDICATIONS: Current Outpatient Medications  Medication Sig Dispense Refill  . budesonide (PULMICORT) 180 MCG/ACT inhaler Inhale 1 puff into the lungs daily. 1 Inhaler 11  . desogestrel-ethinyl estradiol (APRI,EMOQUETTE,SOLIA) 0.15-30 MG-MCG tablet Take 1 tablet by mouth daily.    . fluticasone (FLONASE) 50 MCG/ACT nasal spray Place 1 spray into both nostrils daily. 16 g 11  . levocetirizine (XYZAL) 5 MG tablet Take 1 tablet (5 mg total) by mouth every evening. 30 tablet 11   No current facility-administered medications for this visit.     PAST MEDICAL HISTORY: Past Medical History:  Diagnosis Date  . Allergy   . Cortical visual impairment   . 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   - white matter L side of frontal cortex  . SAH (subarachnoid hemorrhage) (HCC)   . Seizures (HCC)   . Stroke [facility])     PAST SURGICAL HISTORY: Past Surgical History:  Procedure Laterality Date  . EYE SURGERY    . STRABISMUS SURGERY      FAMILY HISTORY: Family History  Problem Relation Age of Onset  . Cancer Maternal Grandmother        Uterin, skin  . Depression Maternal Grandmother   . Diabetes Maternal Grandmother   . Hyperlipidemia Maternal Grandmother   . Hypertension Maternal Grandmother   . Diabetes Maternal Grandfather   . Hyperlipidemia  Maternal Grandfather   . Hypertension Maternal Grandfather   . Other Maternal Grandfather        agent orange  . Depression Paternal Grandmother   . Hyperlipidemia Paternal Grandfather   . Hypertension Paternal Grandfather   . Healthy Mother   . Healthy Father     SOCIAL HISTORY:  Social History   Socioeconomic History  . Marital status: Single    Spouse name: Not on file  . Number of children: 0  . Years of education: currently in college  . Highest education level: Not on file  Occupational History  . 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 Financial resource strain: Not on file  . Food insecurity:    Worry: Not on file    Inability: Not on file  . Transportation needs:    Medical: Not on file    Non-medical: Not on file  Tobacco Use  . Smoking status: Never Smoker  . Smokeless tobacco: Never Used  Substance and Sexual Activity  . Alcohol use: No  . Drug use: No  . Sexual activity: Not Currently    Birth control/protection: Pill  Lifestyle  .  Physical activity:    Days per week: Not on file    Minutes per session: Not on file  . Stress: Not on file  Relationships  . Social connections:    Talks on phone: Not on file    Gets together: Not on file    Attends religious service: Not on file    Active member of club or organization: Not on file    Attends meetings of clubs or organizations: Not on file    Relationship status: Not on file  . Intimate partner violence:    Fear of current or ex partner: Not on file    Emotionally abused: Not on file    Physically abused: Not on file    Forced sexual activity: Not on file  Other Topics Concern  . Not on file  Social History Narrative   Lives at home with parents and sister.   Left-handed.   2-3 cups caffeine per day.     PHYSICAL EXAM   Vitals:   03/24/18 0756  BP: 118/69  Pulse: 82  Weight: 96 lb (43.5 kg)  Height: 5' 2.5" (1.588 m)    Not recorded      Body mass index is  17.28 kg/m.  PHYSICAL EXAMNIATION:  Gen: NAD, conversant, well nourised, obese, well groomed                     Cardiovascular: Regular rate rhythm, no peripheral edema, warm, nontender. Eyes: Conjunctivae clear without exudates or hemorrhage Neck: Supple, no carotid bruits. Pulmonary: Clear to auscultation bilaterally   NEUROLOGICAL EXAM:  MENTAL STATUS: Speech:    Speech is normal; fluent and spontaneous with normal comprehension.  Cognition:     Orientation to time, place and person     Normal recent and remote memory     Normal Attention span and concentration     Normal Language, naming, repeating,spontaneous speech     Fund of knowledge   CRANIAL NERVES: CN II: Visual fields are full to confrontation. Fundoscopic exam is normal with sharp discs and no vascular changes. Pupils are round equal and briskly reactive to light. CN III, IV, VI: extraocular movement are normal. No ptosis. CN V: Facial sensation is intact to pinprick in all 3 divisions bilaterally. Corneal responses are intact.  CN VII: Face is symmetric with normal eye closure and smile. CN VIII: Hearing is normal to rubbing fingers CN IX, X: Palate elevates symmetrically. Phonation is normal. CN XI: Head turning and shoulder shrug are intact CN XII: Tongue is midline with normal movements and no atrophy.  MOTOR: There is no pronator drift of out-stretched arms. Muscle bulk and tone are normal. Muscle strength is normal.  REFLEXES: Reflexes are 2+ and symmetric at the biceps, triceps, knees, and ankles. Plantar responses are flexor.  SENSORY: Intact to light touch, pinprick, positional sensation and vibratory sensation are intact in fingers and toes.  COORDINATION: Rapid alternating movements and fine finger movements are intact. There is no dysmetria on finger-to-nose and heel-knee-shin.    GAIT/STANCE: Posture is normal. Gait is steady with normal steps, base, arm swing, and turning. Heel and toe  walking are normal. Tandem gait is normal.  Romberg is absent.   DIAGNOSTIC DATA (LABS, IMAGING, TESTING) - I reviewed patient records, labs, notes, testing and imaging myself where available.   ASSESSMENT AND PLAN  Aleen Secrist is a 22 y.o. female   Reportedly history of newborn subarachnoid hemorrhage, seizure, Recurrent partial seizure with Todd's paralysis  at age 89 and 7 Abnormal MRI of the brain  Repeat MRI of the brain with and without contrast  EEG  Continue to document spells,   Levert FeinsteinYijun Mccoy Testa, M.D. Ph.D.  [facility] [address], [address] [place], [place] [address] Ph: [phone] Fax: [phone]  CC: Referring Provider

## 2018-03-25 LAB — COMPREHENSIVE METABOLIC PANEL
ALT: 10 IU/L (ref 0–32)
AST: 13 IU/L (ref 0–40)
Albumin/Globulin Ratio: 1.5 (ref 1.2–2.2)
Albumin: 4.3 g/dL (ref 3.5–5.5)
Alkaline Phosphatase: 63 IU/L (ref 39–117)
BUN/Creatinine Ratio: 9 (ref 9–23)
BUN: 7 mg/dL (ref 6–20)
Bilirubin Total: 0.3 mg/dL (ref 0.0–1.2)
CALCIUM: 9.5 mg/dL (ref 8.7–10.2)
CO2: 20 mmol/L (ref 20–29)
CREATININE: 0.81 mg/dL (ref 0.57–1.00)
Chloride: 105 mmol/L (ref 96–106)
GFR calc Af Amer: 121 mL/min/{1.73_m2} (ref >59)
GFR, EST NON AFRICAN AMERICAN: 105 mL/min/{1.73_m2} (ref >59)
GLOBULIN, TOTAL: 2.8 g/dL (ref 1.5–4.5)
GLUCOSE: 80 mg/dL (ref 65–99)
Potassium: 4.7 mmol/L (ref 3.5–5.2)
SODIUM: 140 mmol/L (ref 134–144)
Total Protein: 7.1 g/dL (ref 6.0–8.5)

## 2018-03-25 LAB — CBC WITH DIFFERENTIAL/PLATELET
BASOS: 1 %
Basophils Absolute: 0.1 10*3/uL (ref 0.0–0.2)
EOS (ABSOLUTE): 0.6 10*3/uL — ABNORMAL HIGH (ref 0.0–0.4)
Eos: 8 %
HEMATOCRIT: 41.3 % (ref 34.0–46.6)
Hemoglobin: 12.8 g/dL (ref 11.1–15.9)
IMMATURE GRANULOCYTES: 0 %
Immature Grans (Abs): 0 10*3/uL (ref 0.0–0.1)
Lymphocytes Absolute: 2 10*3/uL (ref 0.7–3.1)
Lymphs: 27 %
MCH: 28.5 pg (ref 26.6–33.0)
MCHC: 31 g/dL — ABNORMAL LOW (ref 31.5–35.7)
MCV: 92 fL (ref 79–97)
MONOS ABS: 0.6 10*3/uL (ref 0.1–0.9)
Monocytes: 8 %
NEUTROS PCT: 56 %
Neutrophils Absolute: 4 10*3/uL (ref 1.4–7.0)
Platelets: 252 10*3/uL (ref 150–450)
RBC: 4.49 x10E6/uL (ref 3.77–5.28)
RDW: 13.4 % (ref 12.3–15.4)
WBC: 7.2 10*3/uL (ref 3.4–10.8)

## 2018-03-25 LAB — TSH: TSH: 1.75 u[IU]/mL (ref 0.450–4.500)

## 2018-03-25 NOTE — Telephone Encounter (Signed)
Patient mom on the dpr called me back and schedule pt MRI [facility]

## 2018-03-26 ENCOUNTER — Telehealth: Payer: Self-pay | Admitting: *Deleted

## 2018-03-26 NOTE — Telephone Encounter (Signed)
Spoke to patient - she is aware of her lab results. 

## 2018-03-26 NOTE — Telephone Encounter (Signed)
-----   Message from Levert FeinsteinYijun Yan, MD sent at 03/26/2018  8:17 AM EDT ----- Please call patient for lab showed no significant abnormality

## 2018-04-08 ENCOUNTER — Ambulatory Visit: Payer: 59

## 2018-04-08 DIAGNOSIS — Z87898 Personal history of other specified conditions: Secondary | ICD-10-CM | POA: Diagnosis not present

## 2018-04-08 MED ORDER — GADOPENTETATE DIMEGLUMINE 469.01 MG/ML IV SOLN
10.0000 mL | Freq: Once | INTRAVENOUS | Status: AC | PRN
Start: 2018-04-08 — End: 2018-04-08
  Administered 2018-04-08: 10 mL via INTRAVENOUS

## 2018-04-09 ENCOUNTER — Ambulatory Visit: Payer: 59 | Admitting: Neurology

## 2018-04-09 DIAGNOSIS — R569 Unspecified convulsions: Secondary | ICD-10-CM | POA: Diagnosis not present

## 2018-04-09 DIAGNOSIS — Z87898 Personal history of other specified conditions: Secondary | ICD-10-CM

## 2018-04-10 ENCOUNTER — Telehealth: Payer: Self-pay | Admitting: Neurology

## 2018-04-10 NOTE — Procedures (Signed)
   HISTORY: 22 years old female, with history of seizure  TECHNIQUE:  16 channel EEG was performed based on standard 10-16 international system. One channel was dedicated to EKG, which has demonstrates normal sinus rhythm of 72 beats per minutes.  Upon awakening, the posterior background activity was mildly dysrhythmic, with mixed alpha and theta range activity, reactive to eye opening and closure.  There was frequent bilateral frontal muscle artifact.  There was occasionally C4 sharp transient.  Photic stimulation was performed, which induced a symmetric photic driving.  Hyperventilation was performed, there was no abnormality elicit.  No sleep was achieved.  CONCLUSION: This is mild abnormal EEG.  There is mild background slowing, and occasionally C4 sharp transient, indicating mild bilateral hemisphere malfunction, with right parietal region irritability.  There is no evidence of epileptiform discharge.  Levert FeinsteinYijun Mala Gibbard, M.D. Ph.D.  [facility] [address] [place], [place] [address] Phone: [phone] Fax:      [phone]

## 2018-04-10 NOTE — Telephone Encounter (Signed)
Please call patient, MRI of the brain showed evidence of chronic injury at bilateral frontal, parietal, occipital region,   I will review films with her at next follow-up visit.   IMPRESSION:   MRI brain (with and without) demonstrating: - Bilateral frontal, parietal and occipital encephalomalacia and gliosis, possibly related to remote watershed ischemic infarctions or other hypoxic ischemic encephalopathy. - No acute findings.

## 2018-04-10 NOTE — Telephone Encounter (Signed)
Spoke to patient's mother, Lindaann PascalShannon Bosak, on [place].  She is aware of the results and verbalized understanding.  They will keep the pending follow up for further discussion.

## 2018-04-14 DIAGNOSIS — H5213 Myopia, bilateral: Secondary | ICD-10-CM | POA: Diagnosis not present

## 2018-04-14 DIAGNOSIS — H538 Other visual disturbances: Secondary | ICD-10-CM | POA: Diagnosis not present

## 2018-04-20 ENCOUNTER — Ambulatory Visit (INDEPENDENT_AMBULATORY_CARE_PROVIDER_SITE_OTHER): Payer: 59 | Admitting: Family Medicine

## 2018-04-20 ENCOUNTER — Encounter: Payer: Self-pay | Admitting: Family Medicine

## 2018-04-20 VITALS — BP 117/81 | HR 81 | Ht 63.0 in | Wt 95.9 lb

## 2018-04-20 DIAGNOSIS — I609 Nontraumatic subarachnoid hemorrhage, unspecified: Secondary | ICD-10-CM

## 2018-04-20 DIAGNOSIS — Z0000 Encounter for general adult medical examination without abnormal findings: Secondary | ICD-10-CM

## 2018-04-20 DIAGNOSIS — R569 Unspecified convulsions: Secondary | ICD-10-CM

## 2018-04-20 DIAGNOSIS — R42 Dizziness and giddiness: Secondary | ICD-10-CM | POA: Diagnosis not present

## 2018-04-20 DIAGNOSIS — N946 Dysmenorrhea, unspecified: Secondary | ICD-10-CM

## 2018-04-20 DIAGNOSIS — Z3041 Encounter for surveillance of contraceptive pills: Secondary | ICD-10-CM | POA: Diagnosis not present

## 2018-04-20 DIAGNOSIS — Z719 Counseling, unspecified: Secondary | ICD-10-CM

## 2018-04-20 DIAGNOSIS — R636 Underweight: Secondary | ICD-10-CM | POA: Insufficient documentation

## 2018-04-20 NOTE — Patient Instructions (Addendum)
Good news-  you have had vaccines and are up-to-date.     Preventive Care for Adults, Female  A healthy lifestyle and preventive care can promote health and wellness. Preventive health guidelines for women include the following key practices.   A routine yearly physical is a good way to check with your health care provider about your health and preventive screening. It is a chance to share any concerns and updates on your health and to receive a thorough exam.   Visit your dentist for a routine exam and preventive care every 6 months. Brush your teeth twice a day and floss once a day. Good oral hygiene prevents tooth decay and gum disease.   The frequency of eye exams is based on your age, health, family medical history, use of contact lenses, and other factors. Follow your health care provider's recommendations for frequency of eye exams.   Eat a healthy diet. Foods like vegetables, fruits, whole grains, low-fat dairy products, and lean protein foods contain the nutrients you need without too many calories. Decrease your intake of foods high in solid fats, added sugars, and salt. Eat the right amount of calories for you.Get information about a proper diet from your health care provider, if necessary.   Regular physical exercise is one of the most important things you can do for your health. Most adults should get at least 150 minutes of moderate-intensity exercise (any activity that increases your heart rate and causes you to sweat) each week. In addition, most adults need muscle-strengthening exercises on 2 or more days a week.   Maintain a healthy weight. The body mass index (BMI) is a screening tool to identify possible weight problems. It provides an estimate of body fat based on height and weight. Your health care provider can find your BMI, and can help you achieve or maintain a healthy weight.For adults 20 years and older:   - A BMI below 18.5 is considered underweight.   - A BMI  of 18.5 to 24.9 is normal.   - A BMI of 25 to 29.9 is considered overweight.   - A BMI of 30 and above is considered obese.   Maintain normal blood lipids and cholesterol levels by exercising and minimizing your intake of trans and saturated fats.  Eat a balanced diet with plenty of fruit and vegetables. Blood tests for lipids and cholesterol should begin at age 32 and be repeated every 5 years minimum.  If your lipid or cholesterol levels are high, you are over 40, or you are at high risk for heart disease, you may need your cholesterol levels checked more frequently.Ongoing high lipid and cholesterol levels should be treated with medicines if diet and exercise are not working.   If you smoke, find out from your health care provider how to quit. If you do not use tobacco, do not start.   Lung cancer screening is recommended for adults aged 69-80 years who are at high risk for developing lung cancer because of a history of smoking. A yearly low-dose CT scan of the lungs is recommended for people who have at least a 30-pack-year history of smoking and are a current smoker or have quit within the past 15 years. A pack year of smoking is smoking an average of 1 pack of cigarettes a day for 1 year (for example: 1 pack a day for 30 years or 2 packs a day for 15 years). Yearly screening should continue until the smoker has stopped smoking for  at least 15 years. Yearly screening should be stopped for people who develop a health problem that would prevent them from having lung cancer treatment.   If you are pregnant, do not drink alcohol. If you are breastfeeding, be very cautious about drinking alcohol. If you are not pregnant and choose to drink alcohol, do not have more than 1 drink per day. One drink is considered to be 12 ounces (355 mL) of beer, 5 ounces (148 mL) of wine, or 1.5 ounces (44 mL) of liquor.   Avoid use of street drugs. Do not share needles with anyone. Ask for help if you need support  or instructions about stopping the use of drugs.   High blood pressure causes heart disease and increases the risk of stroke. Your blood pressure should be checked at least yearly.  Ongoing high blood pressure should be treated with medicines if weight loss and exercise do not work.   If you are 65-29 years old, ask your health care provider if you should take aspirin to prevent strokes.   Diabetes screening involves taking a blood sample to check your fasting blood sugar level. This should be done once every 3 years, after age 14, if you are within normal weight and without risk factors for diabetes. Testing should be considered at a younger age or be carried out more frequently if you are overweight and have at least 1 risk factor for diabetes.   Breast cancer screening is essential preventive care for women. You should practice "breast self-awareness."  This means understanding the normal appearance and feel of your breasts and may include breast self-examination.  Any changes detected, no matter how small, should be reported to a health care provider.  Women in their 80s and 30s should have a clinical breast exam (CBE) by a health care provider as part of a regular health exam every 1 to 3 years.  After age 71, women should have a CBE every year.  Starting at age 71, women should consider having a mammogram (breast X-ray test) every year.  Women who have a family history of breast cancer should talk to their health care provider about genetic screening.  Women at a high risk of breast cancer should talk to their health care providers about having an MRI and a mammogram every year.   -Breast cancer gene (BRCA)-related cancer risk assessment is recommended for women who have family members with BRCA-related cancers. BRCA-related cancers include breast, ovarian, tubal, and peritoneal cancers. Having family members with these cancers may be associated with an increased risk for harmful changes  (mutations) in the breast cancer genes BRCA1 and BRCA2. Results of the assessment will determine the need for genetic counseling and BRCA1 and BRCA2 testing.   The Pap test is a screening test for cervical cancer. A Pap test can show cell changes on the cervix that might become cervical cancer if left untreated. A Pap test is a procedure in which cells are obtained and examined from the lower end of the uterus (cervix).   - Women should have a Pap test starting at age 73.   - Between ages 35 and 1, Pap tests should be repeated every 2 years.   - Beginning at age 36, you should have a Pap test every 3 years as long as the past 3 Pap tests have been normal.   - Some women have medical problems that increase the chance of getting cervical cancer. Talk to your health care provider about these  problems. It is especially important to talk to your health care provider if a new problem develops soon after your last Pap test. In these cases, your health care provider may recommend more frequent screening and Pap tests.   - The above recommendations are the same for women who have or have not gotten the vaccine for human papillomavirus (HPV).   - If you had a hysterectomy for a problem that was not cancer or a condition that could lead to cancer, then you no longer need Pap tests. Even if you no longer need a Pap test, a regular exam is a good idea to make sure no other problems are starting.   - If you are between ages 66 and 72 years, and you have had normal Pap tests going back 10 years, you no longer need Pap tests. Even if you no longer need a Pap test, a regular exam is a good idea to make sure no other problems are starting.   - If you have had past treatment for cervical cancer or a condition that could lead to cancer, you need Pap tests and screening for cancer for at least 20 years after your treatment.   - If Pap tests have been discontinued, risk factors (such as a new sexual partner) need to  be reassessed to determine if screening should be resumed.   - The HPV test is an additional test that may be used for cervical cancer screening. The HPV test looks for the virus that can cause the cell changes on the cervix. The cells collected during the Pap test can be tested for HPV. The HPV test could be used to screen women aged 75 years and older, and should be used in women of any age who have unclear Pap test results. After the age of 50, women should have HPV testing at the same frequency as a Pap test.   Colorectal cancer can be detected and often prevented. Most routine colorectal cancer screening begins at the age of 24 years and continues through age 76 years. However, your health care provider may recommend screening at an earlier age if you have risk factors for colon cancer. On a yearly basis, your health care provider may provide home test kits to check for hidden blood in the stool.  Use of a small camera at the end of a tube, to directly examine the colon (sigmoidoscopy or colonoscopy), can detect the earliest forms of colorectal cancer. Talk to your health care provider about this at age 68, when routine screening begins. Direct exam of the colon should be repeated every 5 -10 years through age 42 years, unless early forms of pre-cancerous polyps or small growths are found.   People who are at an increased risk for hepatitis B should be screened for this virus. You are considered at high risk for hepatitis B if:  -You were born in a country where hepatitis B occurs often. Talk with your health care provider about which countries are considered high risk.  - Your parents were born in a high-risk country and you have not received a shot to protect against hepatitis B (hepatitis B vaccine).  - You have HIV or AIDS.  - You use needles to inject street drugs.  - You live with, or have sex with, someone who has Hepatitis B.  - You get hemodialysis treatment.  - You take certain  medicines for conditions like cancer, organ transplantation, and autoimmune conditions.   Hepatitis C blood  testing is recommended for all people born from [birth year] through 1963 and any individual with known risks for hepatitis C.   Practice safe sex. Use condoms and avoid high-risk sexual practices to reduce the spread of sexually transmitted infections (STIs). STIs include gonorrhea, chlamydia, syphilis, trichomonas, herpes, HPV, and human immunodeficiency virus (HIV). Herpes, HIV, and HPV are viral illnesses that have no cure. They can result in disability, cancer, and death. Sexually active women aged 78 years and younger should be checked for chlamydia. Older women with new or multiple partners should also be tested for chlamydia. Testing for other STIs is recommended if you are sexually active and at increased risk.   Osteoporosis is a disease in which the bones lose minerals and strength with aging. This can result in serious bone fractures or breaks. The risk of osteoporosis can be identified using a bone density scan. Women ages 40 years and over and women at risk for fractures or osteoporosis should discuss screening with their health care providers. Ask your health care provider whether you should take a calcium supplement or vitamin D to There are also several preventive steps women can take to avoid osteoporosis and resulting fractures or to keep osteoporosis from worsening. -->Recommendations include:  Eat a balanced diet high in fruits, vegetables, calcium, and vitamins.  Get enough calcium. The recommended total intake of is 1,200 mg daily; for best absorption, if taking supplements, divide doses into 250-500 mg doses throughout the day. Of the two types of calcium, calcium carbonate is best absorbed when taken with food but calcium citrate can be taken on an empty stomach.  Get enough vitamin D. [employer] and the [employer] recommend at least 1,000 IU per day for women  age 36 and over who are at risk of vitamin D deficiency. Vitamin D deficiency can be caused by inadequate sun exposure (for example, those who live in [place]).  Avoid alcohol and smoking. Heavy alcohol intake (more than 7 drinks per week) increases the risk of falls and hip fracture and women smokers tend to lose bone more rapidly and have lower bone mass than nonsmokers. Stopping smoking is one of the most important changes women can make to improve their health and decrease risk for disease.  Be physically active every day. Weight-bearing exercise (for example, fast walking, hiking, jogging, and weight training) may strengthen bones or slow the rate of bone loss that comes with aging. Balancing and muscle-strengthening exercises can reduce the risk of falling and fracture.  Consider therapeutic medications. Currently, several types of effective drugs are available. Healthcare providers can recommend the type most appropriate for each woman.  Eliminate environmental factors that may contribute to accidents. Falls cause nearly 90% of all osteoporotic fractures, so reducing this risk is an important bone-health strategy. Measures include ample lighting, removing obstructions to walking, using nonskid rugs on floors, and placing mats and/or grab bars in showers.  Be aware of medication side effects. Some common medicines make bones weaker. These include a type of steroid drug called glucocorticoids used for arthritis and asthma, some antiseizure drugs, certain sleeping pills, treatments for endometriosis, and some cancer drugs. An overactive thyroid gland or using too much thyroid hormone for an underactive thyroid can also be a problem. If you are taking these medicines, talk to your doctor about what you can do to help protect your bones.reduce the rate of osteoporosis.    Menopause can be associated with physical symptoms and risks. Hormone replacement therapy is available  to decrease  symptoms and risks. You should talk to your health care provider about whether hormone replacement therapy is right for you.   Use sunscreen. Apply sunscreen liberally and repeatedly throughout the day. You should seek shade when your shadow is shorter than you. Protect yourself by wearing long sleeves, pants, a wide-brimmed hat, and sunglasses year round, whenever you are outdoors.   Once a month, do a whole body skin exam, using a mirror to look at the skin on your back. Tell your health care provider of new moles, moles that have irregular borders, moles that are larger than a pencil eraser, or moles that have changed in shape or color.   -Stay current with required vaccines (immunizations).   Influenza vaccine. All adults should be immunized every year.  Tetanus, diphtheria, and acellular pertussis (Td, Tdap) vaccine. Pregnant women should receive 1 dose of Tdap vaccine during each pregnancy. The dose should be obtained regardless of the length of time since the last dose. Immunization is preferred during the 27th 36th week of gestation. An adult who has not previously received Tdap or who does not know her vaccine status should receive 1 dose of Tdap. This initial dose should be followed by tetanus and diphtheria toxoids (Td) booster doses every 10 years. Adults with an unknown or incomplete history of completing a 3-dose immunization series with Td-containing vaccines should begin or complete a primary immunization series including a Tdap dose. Adults should receive a Td booster every 10 years.  Varicella vaccine. An adult without evidence of immunity to varicella should receive 2 doses or a second dose if she has previously received 1 dose. Pregnant females who do not have evidence of immunity should receive the first dose after pregnancy. This first dose should be obtained before leaving the health care facility. The second dose should be obtained 4 8 weeks after the first dose.  Human  papillomavirus (HPV) vaccine. Females aged 34 26 years who have not received the vaccine previously should obtain the 3-dose series. The vaccine is not recommended for use in pregnant females. However, pregnancy testing is not needed before receiving a dose. If a female is found to be pregnant after receiving a dose, no treatment is needed. In that case, the remaining doses should be delayed until after the pregnancy. Immunization is recommended for any person with an immunocompromised condition through the age of 58 years if she did not get any or all doses earlier. During the 3-dose series, the second dose should be obtained 4 8 weeks after the first dose. The third dose should be obtained 24 weeks after the first dose and 16 weeks after the second dose.  Zoster vaccine. One dose is recommended for adults aged 43 years or older unless certain conditions are present.  Measles, mumps, and rubella (MMR) vaccine. Adults born before [birth year] generally are considered immune to measles and mumps. Adults born in [birth year] or later should have 1 or more doses of MMR vaccine unless there is a contraindication to the vaccine or there is laboratory evidence of immunity to each of the three diseases. A routine second dose of MMR vaccine should be obtained at least 28 days after the first dose for students attending postsecondary schools, health care workers, or international travelers. People who received inactivated measles vaccine or an unknown type of measles vaccine during 1963 1967 should receive 2 doses of MMR vaccine. People who received inactivated mumps vaccine or an unknown type of mumps vaccine before 1979 and  are at high risk for mumps infection should consider immunization with 2 doses of MMR vaccine. For females of childbearing age, rubella immunity should be determined. If there is no evidence of immunity, females who are not pregnant should be vaccinated. If there is no evidence of immunity, females who are pregnant  should delay immunization until after pregnancy. Unvaccinated health care workers born before 55 who lack laboratory evidence of measles, mumps, or rubella immunity or laboratory confirmation of disease should consider measles and mumps immunization with 2 doses of MMR vaccine or rubella immunization with 1 dose of MMR vaccine.  Pneumococcal 13-valent conjugate (PCV13) vaccine. When indicated, a person who is uncertain of her immunization history and has no record of immunization should receive the PCV13 vaccine. An adult aged 67 years or older who has certain medical conditions and has not been previously immunized should receive 1 dose of PCV13 vaccine. This PCV13 should be followed with a dose of pneumococcal polysaccharide (PPSV23) vaccine. The PPSV23 vaccine dose should be obtained at least 8 weeks after the dose of PCV13 vaccine. An adult aged 49 years or older who has certain medical conditions and previously received 1 or more doses of PPSV23 vaccine should receive 1 dose of PCV13. The PCV13 vaccine dose should be obtained 1 or more years after the last PPSV23 vaccine dose.  Pneumococcal polysaccharide (PPSV23) vaccine. When PCV13 is also indicated, PCV13 should be obtained first. All adults aged 54 years and older should be immunized. An adult younger than age 100 years who has certain medical conditions should be immunized. Any person who resides in a nursing home or long-term care facility should be immunized. An adult smoker should be immunized. People with an immunocompromised condition and certain other conditions should receive both PCV13 and PPSV23 vaccines. People with human immunodeficiency virus (HIV) infection should be immunized as soon as possible after diagnosis. Immunization during chemotherapy or radiation therapy should be avoided. Routine use of PPSV23 vaccine is not recommended for American Indians, [place] Natives, or people younger than 65 years unless there are medical conditions  that require PPSV23 vaccine. When indicated, people who have unknown immunization and have no record of immunization should receive PPSV23 vaccine. One-time revaccination 5 years after the first dose of PPSV23 is recommended for people aged 39 64 years who have chronic kidney failure, nephrotic syndrome, asplenia, or immunocompromised conditions. People who received 1 2 doses of PPSV23 before age 25 years should receive another dose of PPSV23 vaccine at age 73 years or later if at least 5 years have passed since the previous dose. Doses of PPSV23 are not needed for people immunized with PPSV23 at or after age 17 years.  Meningococcal vaccine. Adults with asplenia or persistent complement component deficiencies should receive 2 doses of quadrivalent meningococcal conjugate (MenACWY-D) vaccine. The doses should be obtained at least 2 months apart. Microbiologists working with certain meningococcal bacteria, [place] recruits, people at risk during an outbreak, and people who travel to or live in countries with a high rate of meningitis should be immunized. A first-year college student up through age 1 years who is living in a residence hall should receive a dose if she did not receive a dose on or after her 16th birthday. Adults who have certain high-risk conditions should receive one or more doses of vaccine.  Hepatitis A vaccine. Adults who wish to be protected from this disease, have certain high-risk conditions, work with hepatitis A-infected animals, work in hepatitis A research labs, or travel  to or work in countries with a high rate of hepatitis A should be immunized. Adults who were previously unvaccinated and who anticipate close contact with an international adoptee during the first 60 days after arrival in the [place] from a country with a high rate of hepatitis A should be immunized.  Hepatitis B vaccine.  Adults who wish to be protected from this disease, have certain high-risk conditions,  may be exposed to blood or other infectious body fluids, are household contacts or sex partners of hepatitis B positive people, are clients or workers in certain care facilities, or travel to or work in countries with a high rate of hepatitis B should be immunized.  Haemophilus influenzae type b (Hib) vaccine. A previously unvaccinated person with asplenia or sickle cell disease or having a scheduled splenectomy should receive 1 dose of Hib vaccine. Regardless of previous immunization, a recipient of a hematopoietic stem cell transplant should receive a 3-dose series 6 12 months after her successful transplant. Hib vaccine is not recommended for adults with HIV infection.  Preventive Services / Frequency Ages 31 to 39years  Blood pressure check.** / Every 1 to 2 years.  Lipid and cholesterol check.** / Every 5 years beginning at age 86.  Clinical breast exam.** / Every 3 years for women in their 33s and 33s.  BRCA-related cancer risk assessment.** / For women who have family members with a BRCA-related cancer (breast, ovarian, tubal, or peritoneal cancers).  Pap test.** / Every 2 years from ages 20 through 28. Every 3 years starting at age 65 through age 51 or 41 with a history of 3 consecutive normal Pap tests.  HPV screening.** / Every 3 years from ages 41 through ages 69 to 71 with a history of 3 consecutive normal Pap tests.  Hepatitis C blood test.** / For any individual with known risks for hepatitis C.  Skin self-exam. / Monthly.  Influenza vaccine. / Every year.  Tetanus, diphtheria, and acellular pertussis (Tdap, Td) vaccine.** / Consult your health care provider. Pregnant women should receive 1 dose of Tdap vaccine during each pregnancy. 1 dose of Td every 10 years.  Varicella vaccine.** / Consult your health care provider. Pregnant females who do not have evidence of immunity should receive the first dose after pregnancy.  HPV vaccine. / 3 doses over 6 months, if 70 and  younger. The vaccine is not recommended for use in pregnant females. However, pregnancy testing is not needed before receiving a dose.  Measles, mumps, rubella (MMR) vaccine.** / You need at least 1 dose of MMR if you were born in 1957 or later. You may also need a 2nd dose. For females of childbearing age, rubella immunity should be determined. If there is no evidence of immunity, females who are not pregnant should be vaccinated. If there is no evidence of immunity, females who are pregnant should delay immunization until after pregnancy.  Pneumococcal 13-valent conjugate (PCV13) vaccine.** / Consult your health care provider.  Pneumococcal polysaccharide (PPSV23) vaccine.** / 1 to 2 doses if you smoke cigarettes or if you have certain conditions.  Meningococcal vaccine.** / 1 dose if you are age 35 to 53 years and a Market researcher living in a residence hall, or have one of several medical conditions, you need to get vaccinated against meningococcal disease. You may also need additional booster doses.  Hepatitis A vaccine.** / Consult your health care provider.  Hepatitis B vaccine.** / Consult your health care provider.  Haemophilus influenzae type  b (Hib) vaccine.** / Consult your health care provider.  Ages 84 to 64years  Blood pressure check.** / Every 1 to 2 years.  Lipid and cholesterol check.** / Every 5 years beginning at age 53 years.  Lung cancer screening. / Every year if you are aged 33 80 years and have a 30-pack-year history of smoking and currently smoke or have quit within the past 15 years. Yearly screening is stopped once you have quit smoking for at least 15 years or develop a health problem that would prevent you from having lung cancer treatment.  Clinical breast exam.** / Every year after age 64 years.  BRCA-related cancer risk assessment.** / For women who have family members with a BRCA-related cancer (breast, ovarian, tubal, or peritoneal  cancers).  Mammogram.** / Every year beginning at age 48 years and continuing for as long as you are in good health. Consult with your health care provider.  Pap test.** / Every 3 years starting at age 14 years through age 73 or 73 years with a history of 3 consecutive normal Pap tests.  HPV screening.** / Every 3 years from ages 45 years through ages 75 to 62 years with a history of 3 consecutive normal Pap tests.  Fecal occult blood test (FOBT) of stool. / Every year beginning at age 39 years and continuing until age 23 years. You may not need to do this test if you get a colonoscopy every 10 years.  Flexible sigmoidoscopy or colonoscopy.** / Every 5 years for a flexible sigmoidoscopy or every 10 years for a colonoscopy beginning at age 76 years and continuing until age 24 years.  Hepatitis C blood test.** / For all people born from [birth year] through 1963 and any individual with known risks for hepatitis C.  Skin self-exam. / Monthly.  Influenza vaccine. / Every year.  Tetanus, diphtheria, and acellular pertussis (Tdap/Td) vaccine.** / Consult your health care provider. Pregnant women should receive 1 dose of Tdap vaccine during each pregnancy. 1 dose of Td every 10 years.  Varicella vaccine.** / Consult your health care provider. Pregnant females who do not have evidence of immunity should receive the first dose after pregnancy.  Zoster vaccine.** / 1 dose for adults aged 20 years or older.  Measles, mumps, rubella (MMR) vaccine.** / You need at least 1 dose of MMR if you were born in 1957 or later. You may also need a 2nd dose. For females of childbearing age, rubella immunity should be determined. If there is no evidence of immunity, females who are not pregnant should be vaccinated. If there is no evidence of immunity, females who are pregnant should delay immunization until after pregnancy.  Pneumococcal 13-valent conjugate (PCV13) vaccine.** / Consult your health care  provider.  Pneumococcal polysaccharide (PPSV23) vaccine.** / 1 to 2 doses if you smoke cigarettes or if you have certain conditions.  Meningococcal vaccine.** / Consult your health care provider.  Hepatitis A vaccine.** / Consult your health care provider.  Hepatitis B vaccine.** / Consult your health care provider.  Haemophilus influenzae type b (Hib) vaccine.** / Consult your health care provider.  Ages 7 years and over  Blood pressure check.** / Every 1 to 2 years.  Lipid and cholesterol check.** / Every 5 years beginning at age 18 years.  Lung cancer screening. / Every year if you are aged 57 80 years and have a 30-pack-year history of smoking and currently smoke or have quit within the past 15 years. Yearly screening is stopped once  you have quit smoking for at least 15 years or develop a health problem that would prevent you from having lung cancer treatment.  Clinical breast exam.** / Every year after age 82 years.  BRCA-related cancer risk assessment.** / For women who have family members with a BRCA-related cancer (breast, ovarian, tubal, or peritoneal cancers).  Mammogram.** / Every year beginning at age 57 years and continuing for as long as you are in good health. Consult with your health care provider.  Pap test.** / Every 3 years starting at age 31 years through age 79 or 57 years with 3 consecutive normal Pap tests. Testing can be stopped between 65 and 70 years with 3 consecutive normal Pap tests and no abnormal Pap or HPV tests in the past 10 years.  HPV screening.** / Every 3 years from ages 59 years through ages 10 or 21 years with a history of 3 consecutive normal Pap tests. Testing can be stopped between 65 and 70 years with 3 consecutive normal Pap tests and no abnormal Pap or HPV tests in the past 10 years.  Fecal occult blood test (FOBT) of stool. / Every year beginning at age 39 years and continuing until age 76 years. You may not need to do this test if you  get a colonoscopy every 10 years.  Flexible sigmoidoscopy or colonoscopy.** / Every 5 years for a flexible sigmoidoscopy or every 10 years for a colonoscopy beginning at age 24 years and continuing until age 19 years.  Hepatitis C blood test.** / For all people born from [birth year] through 1963 and any individual with known risks for hepatitis C.  Osteoporosis screening.** / A one-time screening for women ages 75 years and over and women at risk for fractures or osteoporosis.  Skin self-exam. / Monthly.  Influenza vaccine. / Every year.  Tetanus, diphtheria, and acellular pertussis (Tdap/Td) vaccine.** / 1 dose of Td every 10 years.  Varicella vaccine.** / Consult your health care provider.  Zoster vaccine.** / 1 dose for adults aged 67 years or older.  Pneumococcal 13-valent conjugate (PCV13) vaccine.** / Consult your health care provider.  Pneumococcal polysaccharide (PPSV23) vaccine.** / 1 dose for all adults aged 5 years and older.  Meningococcal vaccine.** / Consult your health care provider.  Hepatitis A vaccine.** / Consult your health care provider.  Hepatitis B vaccine.** / Consult your health care provider.  Haemophilus influenzae type b (Hib) vaccine.** / Consult your health care provider. ** Family history and personal history of risk and conditions may change your health care provider's recommendations. Document Released: 11/26/2001 Document Revised: 07/21/2013  [facility] Patient Information 2014 [place], [place].   EXERCISE AND DIET:  We recommended that you start or continue a regular exercise program for good health. Regular exercise means any activity that makes your heart beat faster and makes you sweat.  We recommend exercising at least 30 minutes per day at least 3 days a week, preferably 5.  We also recommend a diet low in fat and sugar / carbohydrates.  Inactivity, poor dietary choices and obesity can cause diabetes, heart attack, stroke, and kidney damage, among  others.     ALCOHOL AND SMOKING:  Women should limit their alcohol intake to no more than 7 drinks/beers/glasses of wine (combined, not each!) per week. Moderation of alcohol intake to this level decreases your risk of breast cancer and liver damage.  ( And of course, no recreational drugs are part of a healthy lifestyle.)  Also, you should not be smoking  at all or even being exposed to second hand smoke. Most people know smoking can cause cancer, and various heart and lung diseases, but did you know it also contributes to weakening of your bones?  Aging of your skin?  Yellowing of your teeth and nails?   CALCIUM AND VITAMIN D:  Adequate intake of calcium and Vitamin D are recommended.  The recommendations for exact amounts of these supplements seem to change often, but generally speaking 600 mg of calcium (either carbonate or citrate) and 800 units of Vitamin D per day seems prudent. Certain women may benefit from higher intake of Vitamin D.  If you are among these women, your doctor will have told you during your visit.     PAP SMEARS:  Pap smears, to check for cervical cancer or precancers,  have traditionally been done yearly, although recent scientific advances have shown that most women can have pap smears less often.  However, every woman still should have a physical exam from her gynecologist or primary care physician every year. It will include a breast check, inspection of the vulva and vagina to check for abnormal growths or skin changes, a visual exam of the cervix, and then an exam to evaluate the size and shape of the uterus and ovaries.  And after 20 years of age, a rectal exam is indicated to check for rectal cancers. We will also provide age appropriate advice regarding health maintenance, like when you should have certain vaccines, screening for sexually transmitted diseases, bone density testing, colonoscopy, mammograms, etc.    MAMMOGRAMS:  All women over 16 years old should have  a yearly mammogram. Many facilities now offer a "3D" mammogram, which may cost around $50 extra out of pocket. If possible,  we recommend you accept the option to have the 3D mammogram performed.  It both reduces the number of women who will be called back for extra views which then turn out to be normal, and it is better than the routine mammogram at detecting truly abnormal areas.     COLONOSCOPY:  Colonoscopy to screen for colon cancer is recommended for all women at age 33.  We know, you hate the idea of the prep.  We agree, BUT, having colon cancer and not knowing it is worse!!  Colon cancer so often starts as a polyp that can be seen and removed at colonscopy, which can quite literally save your life!  And if your first colonoscopy is normal and you have no family history of colon cancer, most women don't have to have it again for 10 years.  Once every ten years, you can do something that may end up saving your life, right?  We will be happy to help you get it scheduled when you are ready.  Be sure to check your insurance coverage so you understand how much it will cost.  It may be covered as a preventative service at no cost, but you should check your particular policy.

## 2018-04-20 NOTE — Progress Notes (Signed)
Impression and Recommendations:    1. Encounter for wellness examination   2. Health education/counseling   3. Family planning, BCP (birth control pills) maintenance   4. Underweight due to inadequate caloric intake   5. Dizziness   6. h/o Neonatal Seizures   7. Dysmenorrhea in adolescent   8. h/o SAH (subarachnoid hemorrhage) at birth;  foreceps delivery      1) Anticipatory Guidance: Discussed importance of wearing a seatbelt while driving, not texting while driving; sunscreen when outside along with yearly skin surveillance; eating a well balanced and modest diet; physical activity at least 25 minutes per day or 150 min/ week of moderate to intense activity.  2) Immunizations / Screenings / Labs:  All immunizations and screenings that patient agrees to, are up-to-date per recommendations or will be updated today.  Patient understands the needs for q 24modental and yearly vision screens which pt will schedule independently. Obtain CBC, CMP, HgA1c, Lipid panel, TSH and vit D when fasting if not already done recently.   -Pt advised on yearly STI screenings once becomes sexually active.    3) Low BMI:   Explained to patient what BMI refers to, and what it means medically. Told patient to think about it as a "medical risk stratification measurement" and how decreased BMI is associated with increased risk or worsening state of various diseases such as anemia and brittle bone disease, etc.   Health counseling performed.  All questions answered.  4) Dizziness and h/o of neonatal seizures:  -[facility] of seizures that involve intermittent dizziness as a precursor side effect.   -She is not on any anti-epiletic medications at this time.   -Recently had EEG and MRI brain done.  These results were reviewed in the chart  -Advised the patient to maintain follow up with her neurologist at this time.   5) Dysmenorrhea:  -Patient  advised to continue on her Rx Apri as prescribed.    Orders Placed This Encounter  Procedures  . CBC with Differential/Platelet  . Comprehensive metabolic panel  . VITAMIN D 25 Hydroxy (Vit-D Deficiency, Fractures)  . TSH  . Hemoglobin A1c  . Lipid panel  . T4, free  . Iron, TIBC and Ferritin Panel  . Transferrin    Gross side effects, risk and benefits, and alternatives of medications discussed with patient.  Patient is aware that all medications have potential side effects and we are unable to predict every side effect or drug-drug interaction that may occur.  Expresses verbal understanding and consents to current therapy plan and treatment regimen.  Follow up in 6 months for an OV. F-up preventative CPE in 1 year. F/up sooner for chronic care management as discussed and/or prn.  Please see orders placed and AVS handed out to patient at the end of our visit for further patient instructions/ counseling done pertaining to today's office visit.   [name] DO 9:06 AM  This document serves as a record of services personally performed by [name] DO. It was created on her behalf by SSteva Colder a trained medical scribe. The creation of this record is based on the scribe's personal observations and the provider's statements to them.   I have reviewed the above medical documentation for accuracy and completeness and I concur.  [name]07/08/19 9:06 AM    Subjective:    Chief Complaint  Patient presents with  . Annual Exam  HPI: [facility] is a 22 y.o. female who presents to [facility] today a yearly health maintenance exam. She has been fasting since 6 PM last night. She is currently on Apri to aid with regulating her menstrual cycles without any issues at this time. She works at a daycare still and notes that she has been working out more. She will be graduating in 1 semester with an early childhood certificate.   She  notes that her mother has a hx of iron deficiency anemia and she was given an iron tablet several days ago by her mother. Her mother was contributing her dizziness to a hx of iron deficiency.   Vaccinations:  She notes that she hasn't had an HPV vaccination to her knowledge. She denies being sexually active at this time. She denies FHx of breast cancer. However on immunization records, she has had her HPV vaccinations.   Appetite:  She notes that she doesn't eat a lot due to having a lack of appetite. She notes that she does like her body.   Seasonal Allergies:  She needs a refill of her Xyzal at this time.   Dizziness:  She recently had an EEG and a MRI brain that resulted normal per patient. She notes that her neurologist doesn't think that her dizziness is related to her seizures. On patient chart review, it is noted that there were abnormalities to her EEG and MRI and that the patient has had recurrent dizziness, emesis, and right arm weakness since age 48 in [place], [place]. She has been evaluated in [place] and [facility] for her symptoms. She wasn't started on antiepileptic medications.   On review of systems, she reports blurred vision and intermittent dizziness. She just had her eye dilated and everything resulted normal. She was taken off her restrictions for driving. She recently went to the dentist as well. She reports that she had one instance of nausea and vomiting s/p an episode of dizziness that resolved on its own. she denies loss of vision, SOB, CP, constipation, diarrhea, vomiting, and any other symptoms. Pertinent positives are listed and detailed within the above HPI.   Health Maintenance Summary Reviewed and updated, unless pt declines services.  Colonoscopy:     (Unnecessary secondary to < 10 or > 70 years old.) Tobacco History Reviewed:   Y  CT scan for screening lung CA:   N/a Abdominal Ultrasound:     ( Unnecessary secondary to < 45 or > 62 years old) Alcohol:    No  concerns, no excessive use Exercise Habits:   Not meeting AHA guidelines STD concerns:   none Drug Use:   None Birth control method:   n/a Menses regular:     n/a Lumps or breast concerns:      no Breast Cancer Family History:      No Bone/ DEXA scan:    Unnecessary due to < 65 and average risk  Immunization History  Administered Date(s) Administered  . DTaP 01/20/1998, 03/24/1998, 06/21/1998, 08/08/1999, 01/27/2003  . Hepatitis A 04/20/2008, 10/21/2008  . Hepatitis B Jun 02, 1998, 01/19/1998, 06/21/1998  . HiB (PRP-OMP) 01/20/1998, 03/24/1998, 06/21/1998, 12/06/1998  . Hpv 04/20/2008, 06/21/2008, 10/21/2008  . IPV 01/20/1998, 03/24/1998, 08/08/1999, 01/27/2003  . Influenza,inj,Quad PF,6+ Mos 09/18/2016  . MMR 03/16/1999, 01/27/2003  . Meningococcal Conjugate 11/26/2010, 05/18/2015  . Rotavirus Monovalent 01/20/1998  . Tdap 06/06/2009  . Varicella 12/06/1998, 04/20/2008    Health Maintenance  Topic Date Due  . INFLUENZA VACCINE  06/19/2018 (Originally 05/14/2018)  . TETANUS/TDAP  10/17/2018 (Originally 11/21/2016)  . HIV Screening  10/17/2018 (Originally 11/21/2012)     Wt Readings from Last 3 Encounters:  04/20/18 95 lb 14.4 oz (43.5 kg)  03/24/18 96 lb (43.5 kg)  10/17/17 98 lb 14.4 oz (44.9 kg) (3 %, Z= -1.92)*   * Growth percentiles are based on CDC (Girls, 2-20 Years) data.   BP Readings from Last 3 Encounters:  04/20/18 117/81  03/24/18 118/69  10/17/17 127/88   Pulse Readings from Last 3 Encounters:  04/20/18 81  03/24/18 82  10/17/17 97     Past Medical History:  Diagnosis Date  . Allergy   . Cortical visual impairment   . 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   - white matter L side of frontal cortex  . SAH (subarachnoid hemorrhage) ([place])   . Seizures ([place])   . Stroke [facility])       Past Surgical History:  Procedure Laterality Date  . EYE SURGERY    . STRABISMUS SURGERY         Family History  Problem Relation Age of Onset  . Cancer Maternal Grandmother        Uterin, skin  . Depression Maternal Grandmother   . Diabetes Maternal Grandmother   . Hyperlipidemia Maternal Grandmother   . Hypertension Maternal Grandmother   . Diabetes Maternal Grandfather   . Hyperlipidemia Maternal Grandfather   . Hypertension Maternal Grandfather   . Other Maternal Grandfather        agent orange  . Depression Paternal Grandmother   . Hyperlipidemia Paternal Grandfather   . Hypertension Paternal Grandfather   . Healthy Mother   . Healthy Father       Social History   Substance and Sexual Activity  Drug Use No  ,   Social History   Substance and Sexual Activity  Alcohol Use No  ,   Social History   Tobacco Use  Smoking Status Never Smoker  Smokeless Tobacco Never Used  ,   Social History   Substance and Sexual Activity  Sexual Activity Not Currently  . Birth control/protection: Pill    Current Outpatient Medications on File Prior to Visit  Medication Sig Dispense Refill  . budesonide (PULMICORT) 180 MCG/ACT inhaler Inhale 1 puff into the lungs daily. 1 Inhaler 11  . desogestrel-ethinyl estradiol (APRI,EMOQUETTE,SOLIA) 0.15-30 MG-MCG tablet Take 1 tablet by mouth daily.    . fluticasone (FLONASE) 50 MCG/ACT nasal spray Place 1 spray into both nostrils daily. 16 g 11  . levocetirizine (XYZAL) 5 MG tablet Take 1 tablet (5 mg total) by mouth every evening. 30 tablet 11   No current facility-administered medications on file prior to visit.     Allergies: Other; Peanut-containing drug products; and Celery oil  Review of Systems: General:   Denies fever, chills, unexplained weight loss.  Optho/Auditory:   Denies visual changes, LOV, (+) Blurred vision.  Respiratory:   Denies SOB, DOE more than baseline levels.  Cardiovascular:   Denies chest pain, palpitations, new onset peripheral edema  Gastrointestinal:   Denies nausea, vomiting, diarrhea.   Genitourinary: Denies dysuria, freq/ urgency, flank pain or discharge from genitals.  Endocrine:     Denies hot or cold intolerance, polyuria, polydipsia. Musculoskeletal:   Denies unexplained myalgias, joint swelling, unexplained arthralgias, gait problems.  Skin:  Denies rash, suspicious lesions Neurological:     Denies unexplained weakness,  numbness (+) intermittent dizziness.  Psychiatric/Behavioral:   Denies mood changes, suicidal or homicidal ideations, hallucinations    Objective:    Blood pressure 117/81, pulse 81, height '5\' 3"'  (1.6 m), weight 95 lb 14.4 oz (43.5 kg), last menstrual period 04/01/2018, SpO2 99 %. Body mass index is 16.99 kg/m. General Appearance:    Alert, cooperative, no distress, appears stated age  Head:    Normocephalic, without obvious abnormality, atraumatic  Eyes:    PERRL, conjunctiva/corneas clear, EOM's intact, fundi    benign, both eyes  Ears:    Normal TM's and external ear canals, both ears  Nose:   Nares normal, septum midline, mucosa normal, no drainage    or sinus tenderness  Throat:   Lips w/o lesion, mucosa moist, and tongue normal; teeth and   gums normal  Neck:   Supple, symmetrical, trachea midline, no adenopathy;    thyroid:  no enlargement/tenderness/nodules; no carotid   bruit or JVD  Back:     Symmetric, no curvature, ROM normal, no CVA tenderness  Lungs:     Clear to auscultation bilaterally, respirations unlabored, no       Wh/ R/ R  Chest Wall:    No tenderness or gross deformity; normal excursion   Heart:    Regular rate and rhythm, S1 and S2 normal, no murmur, rub   or gallop  Breast Exam:    No tenderness, masses, or nipple abnormality b/l; no d/c  Abdomen:     Soft, non-tender, bowel sounds active all four quadrants, NO   G/R/R, no masses, no organomegaly  Genitalia:    Ext genitalia: without lesion, no rash or discharge, No         tenderness;  Cervix: WNL's w/o discharge or lesion;        Adnexa:  No tenderness or palpable  masses   Rectal:    Normal tone, no masses or tenderness;   guaiac negative stool  Extremities:   Extremities normal, atraumatic, no cyanosis or gross edema  Pulses:   2+ and symmetric all extremities  Skin:   Warm, dry, Skin color, texture, turgor normal, no obvious rashes or lesions Psych: No HI/SI, judgement and insight good, Euthymic mood. Full Affect.  Neurologic:   CNII-XII intact, normal strength, sensation and reflexes    Throughout   DEXA:    [email or web address]   Based on the [place] FRAX tool, a 20 year old white woman with no other risk factors has a 9.3% 10-year risk for any osteoporotic fracture. White women between the ages of 92 and 24 years with equivalent or greater 10-year fracture risks based on specific risk factors include but are not limited to the following persons: 38) a 20 year old current smoker with a BMI less than 21 kg/m2, daily alcohol use, and parental fracture history; 34) a 20 year old woman with a parental fracture history; 22) a 20 year old woman with a BMI less than 21 kg/m2 and daily alcohol use; and 16) a 20 year old current smoker with daily alcohol use.   The FRAX tool also predicts 10-year fracture risks for black, Asian, and Hispanic women in the [place]. In general, estimated fracture risks in nonwhite women are lower than those for white women of the same age. Although the USPSTF recommends using a 9.3% 10-year fracture risk threshold to screen women aged 77 to 13 years,

## 2018-04-22 LAB — COMPREHENSIVE METABOLIC PANEL
A/G RATIO: 1.6 (ref 1.2–2.2)
ALK PHOS: 62 IU/L (ref 39–117)
ALT: 12 IU/L (ref 0–32)
AST: 18 IU/L (ref 0–40)
Albumin: 4.3 g/dL (ref 3.5–5.5)
BILIRUBIN TOTAL: 0.3 mg/dL (ref 0.0–1.2)
BUN/Creatinine Ratio: 5 — ABNORMAL LOW (ref 9–23)
BUN: 5 mg/dL — ABNORMAL LOW (ref 6–20)
CALCIUM: 9.4 mg/dL (ref 8.7–10.2)
CHLORIDE: 104 mmol/L (ref 96–106)
CO2: 19 mmol/L — ABNORMAL LOW (ref 20–29)
Creatinine, Ser: 0.92 mg/dL (ref 0.57–1.00)
GFR calc Af Amer: 104 mL/min/{1.73_m2} (ref >59)
GFR, EST NON AFRICAN AMERICAN: 90 mL/min/{1.73_m2} (ref >59)
GLOBULIN, TOTAL: 2.7 g/dL (ref 1.5–4.5)
Glucose: 82 mg/dL (ref 65–99)
Potassium: 3.8 mmol/L (ref 3.5–5.2)
SODIUM: 139 mmol/L (ref 134–144)
Total Protein: 7 g/dL (ref 6.0–8.5)

## 2018-04-22 LAB — IRON,TIBC AND FERRITIN PANEL
FERRITIN: 33 ng/mL (ref 15–150)
Iron Saturation: 27 % (ref 15–55)
Iron: 102 ug/dL (ref 27–159)
Total Iron Binding Capacity: 375 ug/dL (ref 250–450)
UIBC: 273 ug/dL (ref 131–425)

## 2018-04-22 LAB — CBC WITH DIFFERENTIAL/PLATELET
BASOS: 0 %
Basophils Absolute: 0 10*3/uL (ref 0.0–0.2)
EOS (ABSOLUTE): 0.4 10*3/uL (ref 0.0–0.4)
EOS: 5 %
HEMATOCRIT: 37 % (ref 34.0–46.6)
Hemoglobin: 12.5 g/dL (ref 11.1–15.9)
IMMATURE GRANULOCYTES: 0 %
Immature Grans (Abs): 0 10*3/uL (ref 0.0–0.1)
LYMPHS ABS: 2.1 10*3/uL (ref 0.7–3.1)
Lymphs: 26 %
MCH: 30.1 pg (ref 26.6–33.0)
MCHC: 33.8 g/dL (ref 31.5–35.7)
MCV: 89 fL (ref 79–97)
MONOS ABS: 0.6 10*3/uL (ref 0.1–0.9)
Monocytes: 8 %
Neutrophils Absolute: 4.9 10*3/uL (ref 1.4–7.0)
Neutrophils: 61 %
PLATELETS: 262 10*3/uL (ref 150–450)
RBC: 4.15 x10E6/uL (ref 3.77–5.28)
RDW: 13.8 % (ref 12.3–15.4)
WBC: 8.1 10*3/uL (ref 3.4–10.8)

## 2018-04-22 LAB — T4, FREE: FREE T4: 1.17 ng/dL (ref 0.82–1.77)

## 2018-04-22 LAB — HEMOGLOBIN A1C
Est. average glucose Bld gHb Est-mCnc: <74 mg/dL
Hgb A1c MFr Bld: <4.2 % — ABNORMAL LOW (ref 4.8–5.6)

## 2018-04-22 LAB — LIPID PANEL
CHOL/HDL RATIO: 2.9 ratio (ref 0.0–4.4)
CHOLESTEROL TOTAL: 158 mg/dL (ref 100–199)
HDL: 54 mg/dL (ref >39)
LDL CALC: 77 mg/dL (ref 0–99)
Triglycerides: 136 mg/dL (ref 0–149)
VLDL CHOLESTEROL CAL: 27 mg/dL (ref 5–40)

## 2018-04-22 LAB — TRANSFERRIN: Transferrin: 336 mg/dL (ref 200–370)

## 2018-04-22 LAB — TSH: TSH: 1.69 u[IU]/mL (ref 0.450–4.500)

## 2018-04-22 LAB — VITAMIN D 25 HYDROXY (VIT D DEFICIENCY, FRACTURES): VIT D 25 HYDROXY: 29.9 ng/mL — AB (ref 30.0–100.0)

## 2018-06-02 ENCOUNTER — Ambulatory Visit: Payer: 59 | Admitting: Neurology

## 2018-06-02 ENCOUNTER — Telehealth: Payer: Self-pay | Admitting: Neurology

## 2018-06-02 ENCOUNTER — Encounter: Payer: Self-pay | Admitting: Neurology

## 2018-06-02 VITALS — BP 114/77 | HR 103 | Ht 63.0 in | Wt 96.5 lb

## 2018-06-02 DIAGNOSIS — I609 Nontraumatic subarachnoid hemorrhage, unspecified: Secondary | ICD-10-CM

## 2018-06-02 DIAGNOSIS — R930 Abnormal findings on diagnostic imaging of skull and head, not elsewhere classified: Secondary | ICD-10-CM

## 2018-06-02 DIAGNOSIS — H479 Unspecified disorder of visual pathways: Secondary | ICD-10-CM

## 2018-06-02 NOTE — Telephone Encounter (Signed)
Please give her a copy of MRI CD

## 2018-06-02 NOTE — Progress Notes (Signed)
PATIENT: Kathleen Myers DOB: 03-07-1996  Chief Complaint  Patient presents with  . Seizures    She is here with her mother, [name].  They would like to review her MRI and EEG results.  Reports no further seizure-like episodes.     HISTORICAL  Kathleen Myers is a 22 years old female, accompanied by her mom, seen in refer by her primary care physician Dr. Thomasene Lot for evaluation of abnormal MRI of the brain history of seizure, initial evaluation was on March 24, 2018.  She had a forceps delivery, post delivery 5 hours, she was found to be unresponsive, later was diagnosed with subarachnoid hemorrhage, seizure, she was treated  with phenobarbital for about 1 month, there was no recurrent seizure afterwards, phenobarbital was tapered off,  Initially she was developmentally normal, at age [age] 1/2, had one episode at her daycare, she had emesis in her sleep, followed by right-sided weakness, lasting for 2 hours,  Another episode at age 50 in July 2004, she was found by her father had emesis increased sleep, difficulty to be awakening, followed by confusion, she was taken to [facility],  I was also able to review the hospital admission at [facility] on May 01, 2003, on April 30, 2003 at 10 AM, father noted she was in sleeping emesis, was later noted to have weakness of right arm, left side deviation of both eyes, she had unsteady gait, confused, more episode of vomiting, she had a witnessed seizure at the emergency room, she was loaded with fosphenytoin, CT head was negative, on initial examinations, she has a full range of motion of all extremities, left foot 2-3 beats clonus, her mental status improved dramatically after the Ativan, and fosphenytoin was able to  wean off, she was back to baseline by the evening of July 17, there was suggestion of seizure versus migraine headaches,  Laboratory evaluation April 30, 2003, showed normal CMP CBC hemoglobin of 13.6    MRI of the brain on August 12, 2003 performed at [facility] clinic showed evidence of bilateral cortical and subcortical wedge-shaped area of infarct involving the frontal and parietal occipital region with chronic changes and volume loss, the central sulcus is spared on both side, this findings could suggest either global hypoxic brain injury or possibly watershed infarction  MR angiogram on September 02, 2003 was essentially normal vessels, with exception of right vertebral artery is not adequately seen, other than a small narrow tapering at its distal end suggestive of possible retrograde feeding from the other side,  But since that event in 2004, when she was 22 years old, she was noted to have a change, she became slower to respond, she has been struggling with her short-term memory, is especially difficult with her math, she also have staring spells, but no clinical seizure activity,  When the family moved back to [place], she was seen at [facility],   EEG at [facility] on Feb 14, 1999 07 showed frequent left hemispheric spike and wave discharge, maximum over left temporal region, intermittent focal slowing at the left temporal region, findings suggestive of a functional structural lesion involving the left temporal region, may be epileptogenic in nature,  MRI of the brain with without contrast on April 17, 2006 there is no acute intracranial abnormality, there are areas of high T2 flair signal abnormality within the watershed region of bilateral frontal, bilateral parietal, bilateral occipital lobes,  She was never offered antiepileptic medication treatment, she rarely  complains of headaches,  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  EEG in June 2019 was mildly abnormal, there is mild background slowing, occasionally C4 sharp transient, but there was no clear epileptiform discharge,   REVIEW OF SYSTEMS: Full 14 system review of systems performed and notable only for as above  ALLERGIES: Allergies  Allergen Reactions  . Other Other (See Comments)    anaphalaxis with peanuts  . Peanut-Containing Drug Products Anaphylaxis  . Celery Oil Other (See Comments)    HOME MEDICATIONS: Current Outpatient Medications  Medication Sig Dispense Refill  . budesonide (PULMICORT) 180 MCG/ACT inhaler Inhale 1 puff into the lungs daily. 1 Inhaler 11  . desogestrel-ethinyl estradiol (APRI,EMOQUETTE,SOLIA) 0.15-30 MG-MCG tablet Take 1 tablet by mouth daily.    . fluticasone (FLONASE) 50 MCG/ACT nasal spray Place 1 spray into both nostrils daily. 16 g 11  . levocetirizine (XYZAL) 5 MG tablet Take 1 tablet (5 mg total) by mouth every evening. 30 tablet 11   No current facility-administered medications for this visit.     PAST MEDICAL HISTORY: Past Medical History:  Diagnosis Date  . Allergy   . Cortical visual impairment   . 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   - white matter L side of frontal cortex  . SAH (subarachnoid hemorrhage) (HCC)   . Seizures (HCC)   . Stroke [facility](HCC)     PAST SURGICAL HISTORY: Past Surgical History:  Procedure Laterality Date  . EYE SURGERY    . STRABISMUS SURGERY      FAMILY HISTORY: Family History  Problem Relation Age of Onset  . Cancer Maternal Grandmother        Uterin, skin  . Depression Maternal Grandmother   . Diabetes Maternal Grandmother   .  Hyperlipidemia Maternal Grandmother   . Hypertension Maternal Grandmother   . Diabetes Maternal Grandfather   . Hyperlipidemia Maternal Grandfather   . Hypertension Maternal Grandfather   . Other Maternal Grandfather        agent orange  . Depression Paternal Grandmother   . Hyperlipidemia Paternal Grandfather   . Hypertension Paternal Grandfather   . Healthy Mother   . Healthy Father     SOCIAL HISTORY:  Social History   Socioeconomic History  . Marital status: Single    Spouse name: Not on file  . Number of children: 0  . Years of education: currently in college  . Highest education level: Not on file  Occupational History  . Occupation: [employer]  Social Needs  . Financial resource strain: Not on file  . Food insecurity:    Worry: Not on file    Inability: Not on file  . Transportation needs:    Medical: Not on file    Non-medical: Not on file  Tobacco Use  . Smoking status: Never Smoker  . Smokeless tobacco: Never Used  Substance and Sexual Activity  . Alcohol use: No  . Drug use: No  . Sexual activity: Not Currently  Birth control/protection: Pill  Lifestyle  . Physical activity:    Days per week: Not on file    Minutes per session: Not on file  . Stress: Not on file  Relationships  . Social connections:    Talks on phone: Not on file    Gets together: Not on file    Attends religious service: Not on file    Active member of club or organization: Not on file    Attends meetings of clubs or organizations: Not on file    Relationship status: Not on file  . Intimate partner violence:    Fear of current or ex partner: Not on file    Emotionally abused: Not on file    Physically abused: Not on file    Forced sexual activity: Not on file  Other Topics Concern  . Not on file  Social History Narrative   Lives at home with parents and sister.   Left-handed.   2-3 cups caffeine per day.     PHYSICAL EXAM   Vitals:   06/02/18  0804  BP: 114/77  Pulse: (!) 103  Weight: 96 lb 8 oz (43.8 kg)  Height: 5\' 3"  (1.6 m)    Not recorded      Body mass index is 17.09 kg/m.  PHYSICAL EXAMNIATION:  Gen: NAD, conversant, well nourised, obese, well groomed                     Cardiovascular: Regular rate rhythm, no peripheral edema, warm, nontender. Eyes: Conjunctivae clear without exudates or hemorrhage Neck: Supple, no carotid bruits. Pulmonary: Clear to auscultation bilaterally   NEUROLOGICAL EXAM:  MENTAL STATUS: Speech:    Speech is normal; fluent and spontaneous with normal comprehension.  Cognition:     Orientation to time, place and person     Normal recent and remote memory     Normal Attention span and concentration     Normal Language, naming, repeating,spontaneous speech     Fund of knowledge   CRANIAL NERVES: CN II: Visual fields are full to confrontation. Fundoscopic exam is normal with sharp discs and no vascular changes. Pupils are round equal and briskly reactive to light. CN III, IV, VI: extraocular movement are normal. No ptosis. CN V: Facial sensation is intact to pinprick in all 3 divisions bilaterally. Corneal responses are intact.  CN VII: Face is symmetric with normal eye closure and smile. CN VIII: Hearing is normal to rubbing fingers CN IX, X: Palate elevates symmetrically. Phonation is normal. CN XI: Head turning and shoulder shrug are intact CN XII: Tongue is midline with normal movements and no atrophy.  MOTOR: There is no pronator drift of out-stretched arms. Muscle bulk and tone are normal. Muscle strength is normal.  REFLEXES: Reflexes are 2+ and symmetric at the biceps, triceps, knees, and ankles. Plantar responses are flexor.  SENSORY: Intact to light touch, pinprick, positional sensation and vibratory sensation are intact in fingers and toes.  COORDINATION: Rapid alternating movements and fine finger movements are intact. There is no dysmetria on finger-to-nose and  heel-knee-shin.    GAIT/STANCE: Posture is normal. Gait is steady with normal steps, base, arm swing, and turning. Heel and toe walking are normal. Tandem gait is normal.  Romberg is absent.   DIAGNOSTIC DATA (LABS, IMAGING, TESTING) - I reviewed patient records, labs, notes, testing and imaging myself where available.   ASSESSMENT AND PLAN  Latanza Esquibel is a 22 y.o. female   Reportedly history of  hypoxic event as a newborn, suffered a seizure, Recurrent partial seizure with Todd's paralysis at age 89 and 7 Abnormal MRI of the brain  Evidence of bilateral frontal parietal, occipital encephalomalacia and gliosis, possibly related to remote watershed ischemic event or other hypoxic ischemic encephalopathy, no acute abnormality  EEG showed mild background slowing with intermittent right parietal focal slowing, but no evidence of epileptiform discharge  I have advised patient and her family continue to observe, if she has any seizure-like event, call office,  Levert FeinsteinYijun Gissella Niblack, M.D. Ph.D.  [facility] [address], [address] [place], [place] [address] Ph: [phone] Fax: [phone]  CC: Referring Provider

## 2018-08-10 ENCOUNTER — Encounter: Payer: Self-pay | Admitting: Family Medicine

## 2018-08-10 ENCOUNTER — Ambulatory Visit: Payer: 59 | Admitting: Adult Health

## 2018-08-10 ENCOUNTER — Encounter: Payer: Self-pay | Admitting: Adult Health

## 2018-08-10 VITALS — BP 110/77 | HR 78 | Temp 98.4°F | Ht 63.0 in | Wt 100.9 lb

## 2018-08-10 DIAGNOSIS — R51 Headache: Secondary | ICD-10-CM

## 2018-08-10 DIAGNOSIS — R519 Headache, unspecified: Secondary | ICD-10-CM | POA: Insufficient documentation

## 2018-08-10 DIAGNOSIS — R509 Fever, unspecified: Secondary | ICD-10-CM

## 2018-08-10 DIAGNOSIS — R110 Nausea: Secondary | ICD-10-CM

## 2018-08-10 LAB — POCT INFLUENZA A/B
INFLUENZA A, POC: NEGATIVE
Influenza B, POC: NEGATIVE

## 2018-08-10 MED ORDER — ONDANSETRON HCL 4 MG PO TABS: 4.0000 mg | ORAL_TABLET | Freq: Three times a day (TID) | ORAL | 0 refills | Status: DC | PRN

## 2018-08-10 NOTE — Patient Instructions (Addendum)
Nausea, Adult Nausea is the feeling of an upset stomach or having to vomit. Nausea on its own is not usually a serious concern, but it may be an early sign of a more serious medical problem. As nausea gets worse, it can lead to vomiting. If vomiting develops, or if you are not able to drink enough fluids, you are at risk of becoming dehydrated. Dehydration can make you tired and thirsty, cause you to have a dry mouth, and decrease how often you urinate. Older adults and people with other diseases or a weak immune system are at higher risk for dehydration. The main goals of treating your nausea are:  To limit repeated nausea episodes.  To prevent vomiting and dehydration.  Follow these instructions at home: Follow instructions from your health care provider about how to care for yourself at home. Eating and drinking Follow these recommendations as told by your health care provider:  Take an oral rehydration solution (ORS). This is a drink that is sold at pharmacies and retail stores.  Drink clear fluids in small amounts as you are able. Clear fluids include water, ice chips, diluted fruit juice, and low-calorie sports drinks.  Eat bland, easy-to-digest foods in small amounts as you are able. These foods include bananas, applesauce, rice, lean meats, toast, and crackers.  Avoid drinking fluids that contain a lot of sugar or caffeine, such as energy drinks, sports drinks, and soda.  Avoid alcohol.  Avoid spicy or fatty foods.  General instructions  Drink enough fluid to keep your urine clear or pale yellow.  Wash your hands often. If soap and water are not available, use hand sanitizer.  Make sure that all people in your household wash their hands well and often.  Rest at home while you recover.  Take over-the-counter and prescription medicines only as told by your health care provider.  Breathe slowly and deeply when you feel nauseous.  Watch your condition for any  changes.  Keep all follow-up visits as told by your health care provider. This is important. Contact a health care provider if:  You have a headache.  You have new symptoms.  Your nausea gets worse.  You have a fever.  You feel light-headed or dizzy.  You vomit.  You cannot keep fluids down. Get help right away if:  You have pain in your chest, neck, arm, or jaw.  You feel extremely weak or you faint.  You have vomit that is bright red or looks like coffee grounds.  You have bloody or black stools or stools that look like tar.  You have a severe headache, a stiff neck, or both.  You have severe pain, cramping, or bloating in your abdomen.  You have a rash.  You have difficulty breathing or are breathing very quickly.  Your heart is beating very quickly.  Your skin feels cold and clammy.  You feel confused.  You have pain when you urinate.  You have signs of dehydration, such as: ? Dark urine, very little, or no urine. ? Cracked lips. ? Dry mouth. ? Sunken eyes. ? Sleepiness. ? Weakness. These symptoms may represent a serious problem that is an emergency. Do not wait to see if the symptoms will go away. Get medical help right away. Call your local emergency services (911 in the [place] Do not drive yourself to the hospital. This information is not intended to replace advice given to you by your health care provider. Make sure you discuss any questions you have  with your health care provider. Document Released: 11/07/2004 Document Revised: 03/04/2016 Document Reviewed: 06/06/2015 Elsevier Interactive Patient Education  2018 Elsevier Inc[employer]   General Headache Without Cause A headache is pain or discomfort felt around the head or neck area. There are many causes and types of headaches. In some cases, the cause may not be found. Follow these instructions at home: Managing pain  Take over-the-counter and prescription medicines only as told by your doctor.  Lie  down in a dark, quiet room when you have a headache.  If directed, apply ice to the head and neck area: ? Put ice in a plastic bag. ? Place a towel between your skin and the bag. ? Leave the ice on for 20 minutes, 2-3 times per day.  Use a heating pad or hot shower to apply heat to the head and neck area as told by your doctor.  Keep lights dim if bright lights bother you or make your headaches worse. Eating and drinking  Eat meals on a regular schedule.  Lessen how much alcohol you drink.  Lessen how much caffeine you drink, or stop drinking caffeine. General instructions  Keep all follow-up visits as told by your doctor. This is important.  Keep a journal to find out if certain things bring on headaches. For example, write down: ? What you eat and drink. ? How much sleep you get. ? Any change to your diet or medicines.  Relax by getting a massage or doing other relaxing activities.  Lessen stress.  Sit up straight. Do not tighten (tense) your muscles.  Do not use tobacco products. This includes cigarettes, chewing tobacco, or e-cigarettes. If you need help quitting, ask your doctor.  Exercise regularly as told by your doctor.  Get enough sleep. This often means 7-9 hours of sleep. Contact a doctor if:  Your symptoms are not helped by medicine.  You have a headache that feels different than the other headaches.  You feel sick to your stomach (nauseous) or you throw up (vomit).  You have a fever. Get help right away if:  Your headache becomes really bad.  You keep throwing up.  You have a stiff neck.  You have trouble seeing.  You have trouble speaking.  You have pain in the eye or ear.  Your muscles are weak or you lose muscle control.  You lose your balance or have trouble walking.  You feel like you will pass out (faint) or you pass out.  You have confusion. This information is not intended to replace advice given to you by your health care  provider. Make sure you discuss any questions you have with your health care provider. Document Released: 07/09/2008 Document Revised: 03/07/2016 Document Reviewed: 01/23/2015 Elsevier Interactive Patient Education  2018 [employer].  Continue to push fluids and use Ondansetron (Zonfran) as needed for nausea. Continue to use OTC Ibuprofen as needed for headache.   If headache persists, recommend calling your Neurologist. Randie Heinz job on stopping tobacco/vape use! Flu test negative, work excuse provided-okay to return tomorrow. FEEL BETTER!

## 2018-08-10 NOTE — Assessment & Plan Note (Signed)
  Continue to push fluids and use Ondansetron (Zonfran) as needed for nausea. Continue to use OTC Ibuprofen as needed for headache.   If headache persists, recommend calling your Neurologist. Randie Heinz job on stopping tobacco/vape use! Flu test negative, work excuse provided-okay to return tomorrow.

## 2018-08-10 NOTE — Assessment & Plan Note (Signed)
Last dose of OTC Ibuprofen 0600, current temp 98.4 f Not diaphoretic at present

## 2018-08-10 NOTE — Assessment & Plan Note (Signed)
Ondansetron 4mg  Q8H PRN

## 2018-08-10 NOTE — Progress Notes (Signed)
Subjective:    Patient ID: Kathleen Myers, female    DOB: [birth year], 22 y.o.   MRN: [id number]  HPI:   [name] presents with nausea without vomiting, R ear pain (8/10), R occipital HA (8/10), that developed last night.  She reports HA woke her about 0300, she took dose of OTC Ibuprofen 0600 - HA and ear pain both have resolved. She has been pushing water. She reports quitting vape use a few months ago- GREAT! She has hx of seizures, established Neurologist- did not call with HA complaint. She works at daycare, denies any of children acutely ill. She has hx of seasonal allergies She denies CP/dyspnea/coufh/nasal drainage/sore throat/abdominal pain She denies dizziness/change in vision/imbalance She denies recent seizure activity Roommate at [facility] during OV  Patient Care Team    Relationship Specialty Notifications Start End  Thomasene Lot, DO PCP - General Family Medicine  09/18/16   Eldridge Abrahams, MD Referring Physician Pediatric Neurology  09/18/16    Comment: 6 yrs ago  Eileen Stanford, MD Referring Physician Allergy and Immunology  09/18/16   Key, Verita Schneiders, NP Nurse Practitioner Gynecology  09/18/16   Verne Carrow, MD Consulting Physician Ophthalmology  09/18/16    Comment: still actively seeing him    Patient Active Problem List   Diagnosis Date Noted  . Fever 08/10/2018  . Headache, unspecified headache type 08/10/2018  . Nausea without vomiting 08/10/2018  . Abnormal MRI of head 06/02/2018  . Underweight due to inadequate caloric intake 04/20/2018  . Dizziness 04/20/2018  . History of seizure 03/24/2018  . Irregular periods 10/17/2017  . Prolonged periods 10/17/2017  . Dysmenorrhea in adolescent 10/17/2017  . Family history of diabetes mellitus (DM) 10/17/2017  . Reactive airway disease without complication 10/17/2017  . Oral contraceptive use- seen by GYN Darlyn Chamber, NP 10/08/2016  . Cortical visual impairment- age 40 09/18/2016  . Seizure (HCC) 09/18/2016   . h/o Stroke secondary to vomiting episodes per Neurologist 09/18/2016  . h/o SAH (subarachnoid hemorrhage) at birth 09/18/2016  . Poor short term memory 09/18/2016  .  Seasonal and environmental allergies-txed by Dr Barnetta Chapel 09/18/2016  . Upper respiratory symptom 09/18/2016  . Abnormal brain MRI 12/05/2011  . Borderline intellectual disability 12/05/2011  . Neonatal seizures 12/05/2011  . Strabismus 12/05/2011     Past Medical History:  Diagnosis Date  . Allergy   . Cortical visual impairment   . 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   - white matter L side of frontal cortex  . SAH (subarachnoid hemorrhage) (HCC)   . Seizures (HCC)   . Stroke [facility])      Past Surgical History:  Procedure Laterality Date  . EYE SURGERY    . STRABISMUS SURGERY       Family History  Problem Relation Age of Onset  . Cancer Maternal Grandmother        Uterin, skin  . Depression Maternal Grandmother   . Diabetes Maternal Grandmother   . Hyperlipidemia Maternal Grandmother   . Hypertension Maternal Grandmother   . Diabetes Maternal Grandfather   . Hyperlipidemia Maternal Grandfather   . Hypertension Maternal Grandfather   . Other Maternal Grandfather        agent orange  . Depression Paternal Grandmother   . Hyperlipidemia Paternal Grandfather   . Hypertension Paternal Grandfather   . Healthy Mother   . Healthy Father  Social History   Substance and Sexual Activity  Drug Use No     Social History   Substance and Sexual Activity  Alcohol Use No     Social History   Tobacco Use  Smoking Status Never Smoker  Smokeless Tobacco Never Used     Outpatient Encounter Medications as of 08/10/2018  Medication Sig  . budesonide (PULMICORT) 180 MCG/ACT inhaler Inhale 1 puff into the lungs daily.  Marland Kitchen desogestrel-ethinyl estradiol (APRI,EMOQUETTE,SOLIA) 0.15-30 MG-MCG tablet Take 1 tablet by mouth  daily.  . fluticasone (FLONASE) 50 MCG/ACT nasal spray Place 1 spray into both nostrils daily.  Marland Kitchen levocetirizine (XYZAL) 5 MG tablet Take 1 tablet (5 mg total) by mouth every evening.  . ondansetron (ZOFRAN) 4 MG tablet Take 1 tablet (4 mg total) by mouth every 8 (eight) hours as needed for nausea or vomiting.   No facility-administered encounter medications on file as of 08/10/2018.     Allergies: Other; Peanut-containing drug products; and Celery oil  Body mass index is 17.87 kg/m.  Blood pressure 110/77, pulse 78, temperature 98.4 F (36.9 C), temperature source Oral, height 5\' 3"  (1.6 m), weight 100 lb 14.4 oz (45.8 kg), last menstrual period 07/22/2018, SpO2 98 %.  Review of Systems  Constitutional: Positive for appetite change and fatigue. Negative for activity change, chills, diaphoresis, fever and unexpected weight change.  HENT: Positive for ear pain. Negative for congestion, postnasal drip, sore throat, trouble swallowing and voice change.   Respiratory: Negative for cough, chest tightness, shortness of breath, wheezing and stridor.   Cardiovascular: Negative for chest pain, palpitations and leg swelling.  Gastrointestinal: Positive for nausea. Negative for abdominal distention, anal bleeding, blood in stool, constipation, diarrhea and vomiting.  Neurological: Positive for headaches. Negative for dizziness, tremors, seizures, syncope, speech difficulty and weakness.  Psychiatric/Behavioral: Positive for sleep disturbance.       Objective:   Physical Exam  Constitutional: She is oriented to person, place, and time. She appears well-developed and well-nourished.  Non-toxic appearance. She does not appear ill. No distress.  HENT:  Head: Normocephalic and atraumatic.  Right Ear: Tympanic membrane is bulging. Tympanic membrane is not erythematous. No decreased hearing is noted.  Left Ear: Tympanic membrane is not erythematous and not bulging. No decreased hearing is noted.   Nose: No mucosal edema or rhinorrhea. Right sinus exhibits no maxillary sinus tenderness and no frontal sinus tenderness. Left sinus exhibits no frontal sinus tenderness.  Mouth/Throat: Uvula is midline and oropharynx is clear and moist. Mucous membranes are dry. She does not have dentures. No oropharyngeal exudate, posterior oropharyngeal edema, posterior oropharyngeal erythema or tonsillar abscesses. Tonsils are 0 on the right. Tonsils are 0 on the left. No tonsillar exudate.  Eyes: Pupils are equal, round, and reactive to light. EOM are normal.  Neck: Normal range of motion. Neck supple.  Cardiovascular: Normal rate, regular rhythm, normal heart sounds and intact distal pulses.  No murmur heard. Pulmonary/Chest: Effort normal and breath sounds normal. No respiratory distress.  Neurological: She is alert and oriented to person, place, and time. She has normal strength. She is not disoriented.  Skin: Skin is warm and dry. Capillary refill takes less than 2 seconds. No rash noted. She is not diaphoretic. No cyanosis or erythema. No pallor.  Psychiatric: She has a normal mood and affect. Her behavior is normal. Her mood appears not anxious.      Assessment & Plan:   1. Headache, unspecified headache type   2. Fever, unspecified fever cause  3. Nausea without vomiting     Headache, unspecified headache type  Continue to push fluids and use Ondansetron (Zonfran) as needed for nausea. Continue to use OTC Ibuprofen as needed for headache.   If headache persists, recommend calling your Neurologist. Randie Heinz job on stopping tobacco/vape use! Flu test negative, work excuse provided-okay to return tomorrow.  Fever Last dose of OTC Ibuprofen 0600, current temp 98.4 f Not diaphoretic at present  Nausea without vomiting Ondansetron 4mg  Q8H PRN    FOLLOW-UP:  Return if symptoms worsen or fail to improve.

## 2018-10-16 DIAGNOSIS — H509 Unspecified strabismus: Secondary | ICD-10-CM | POA: Diagnosis not present

## 2018-10-16 DIAGNOSIS — H35722 Serous detachment of retinal pigment epithelium, left eye: Secondary | ICD-10-CM | POA: Diagnosis not present

## 2018-10-21 ENCOUNTER — Ambulatory Visit (INDEPENDENT_AMBULATORY_CARE_PROVIDER_SITE_OTHER): Payer: 59 | Admitting: Family Medicine

## 2018-10-21 ENCOUNTER — Other Ambulatory Visit (HOSPITAL_COMMUNITY)
Admission: RE | Admit: 2018-10-21 | Discharge: 2018-10-21 | Disposition: A | Discharge status: Home / Self Care | Payer: 59 | Source: Ambulatory Visit | Attending: Family Medicine | Admitting: Family Medicine

## 2018-10-21 ENCOUNTER — Encounter: Payer: Self-pay | Admitting: Family Medicine

## 2018-10-21 VITALS — BP 121/85 | HR 85 | Temp 97.6°F | Ht 63.0 in | Wt 99.7 lb

## 2018-10-21 DIAGNOSIS — N941 Unspecified dyspareunia: Secondary | ICD-10-CM | POA: Diagnosis not present

## 2018-10-21 DIAGNOSIS — Z113 Encounter for screening for infections with a predominantly sexual mode of transmission: Secondary | ICD-10-CM | POA: Insufficient documentation

## 2018-10-21 NOTE — Progress Notes (Signed)
Impression and Recommendations:    1. Dyspareunia in female   2. Screen for STD (sexually transmitted disease)      Dyspareunia in female - Plan: Cytology - PAP, VITAMIN D 25 Hydroxy (Vit-D Deficiency, Fractures), Hemoglobin A1c, HIV antibody (with reflex), RPR, Hepatitis C Antibody  Screen for STD (sexually transmitted disease) - Plan: Cytology - PAP, VITAMIN D 25 Hydroxy (Vit-D Deficiency, Fractures), Hemoglobin A1c, HIV antibody (with reflex), RPR, Hepatitis C Antibody  - Blood work drawn today. - Flu shot administered today.  1. Dyspareunia - Extensive education provided to patient today.  All questions were answered. - Discussed prudent sexual health practices with patient at length today.  - Reviewed the need to assess for all potential causes of dyspareunia, including the potential for infection.  - Per patient, uses condoms every time, and has only had 2 partners. - Patient is very low risk.  - Need for female exam today.  Pap smear & swabs obtained today.  - Advised patient to not have sex if it continues to hurt.  - If all testing comes back normal, discussed that next step will be transvaginal ultrasound.  If transvaginal ultrasound is still normal, discussed that next step will be consultation with OBGYN.  - Emphasized importance of safe sexual practices. - Reviewed importance of full STD screening panel once yearly.   Orders Placed This Encounter  Procedures  . VITAMIN D 25 Hydroxy (Vit-D Deficiency, Fractures)  . Hemoglobin A1c  . HIV antibody (with reflex)  . RPR  . Hepatitis C Antibody    No orders of the defined types were placed in this encounter.   There are no discontinued medications.   Gross side effects, risk and benefits, and alternatives of medications and treatment plan in general discussed with patient.  Patient is aware that all medications have potential side effects and we are unable to predict every side effect or drug-drug  interaction that may occur.   Patient will call with any questions prior to using medication if they have concerns.    Expresses verbal understanding and consents to current therapy and treatment regimen.  No barriers to understanding were identified.  Red flag symptoms and signs discussed in detail.  Patient expressed understanding regarding what to do in case of emergency\urgent symptoms  Please see AVS handed out to patient at the end of our visit for further patient instructions/ counseling done pertaining to today's office visit.   Return for f/up as needed as well as for yearly exam.     Note:  This note was prepared with assistance of Dragon voice recognition software. Occasional wrong-word or sound-a-like substitutions may have occurred due to the inherent limitations of voice recognition software.  This document serves as a record of services personally performed by Thomasene Lot, DO. It was created on her behalf by Peggye Fothergill, a trained medical scribe. The creation of this record is based on the scribe's personal observations and the provider's statements to them.   I have reviewed the above medical documentation for accuracy and completeness and I concur.  Thomasene Lot, DO 10/21/2018 9:40 PM       -----------------------------------------------------------------------------------------------------------------------------    Subjective:     HPI: Kathleen Myers is a 23 y.o. female who presents to [facility] today for issues as discussed below.  Feels that everything is going okay in general.  Still going to the neurologist.  States "nothing major has happened with the neurologist,  so I haven't really been back."  She goes for follow-up maybe once per year or once every two years.  Dyspareunia She is experiencing pain during sex when her partner is on top.    Notes "it used to not be that way, but it started the past few weeks or  so."    The pain comes and goes, "it's every so often."  This has only been happening for 2-3 weeks.  Denies any new or unusual vaginal discharge.  She and her partner use condoms every time.  Other than two weeks ago, she never experienced this pain prior.  Denies rash on herself or her partner.  Partner has no known symptoms, no penile discharge.    She has had two sexual partners in her life.  Her period just ended on the 5th of January (3 days ago).    Wt Readings from Last 3 Encounters:  10/21/18 99 lb 11.2 oz (45.2 kg)  08/10/18 100 lb 14.4 oz (45.8 kg)  06/02/18 96 lb 8 oz (43.8 kg)   BP Readings from Last 3 Encounters:  10/21/18 121/85  08/10/18 110/77  06/02/18 114/77   Pulse Readings from Last 3 Encounters:  10/21/18 85  08/10/18 78  06/02/18 (!) 103   BMI Readings from Last 3 Encounters:  10/21/18 17.66 kg/m  08/10/18 17.87 kg/m  06/02/18 17.09 kg/m     Patient Care Team    Relationship Specialty Notifications Start End  Thomasene Lotpalski, Aaren Krog, DO PCP - General Family Medicine  09/18/16   Eldridge AbrahamsPizoli, Carolyn Elizabeth, MD Referring Physician Pediatric Neurology  09/18/16    Comment: 6 yrs ago  Eileen StanfordWhelan, Meg, MD Referring Physician Allergy and Immunology  09/18/16   Key, Verita SchneidersEvelyn M, NP Nurse Practitioner Gynecology  09/18/16   Verne CarrowYoung, William, MD Consulting Physician Ophthalmology  09/18/16    Comment: still actively seeing him     Patient Active Problem List   Diagnosis Date Noted  . Dysmenorrhea in adolescent 10/17/2017    Priority: High  . Family history of diabetes mellitus (DM) 10/17/2017    Priority: High  . Oral contraceptive use- seen by GYN Darlyn Chamber- Evelyn Key, NP 10/08/2016    Priority: High  .  Seasonal and environmental allergies-txed by Dr Barnetta ChapelWhelan 09/18/2016    Priority: High  . Borderline intellectual disability 12/05/2011    Priority: High  . Cortical visual impairment- age 89 09/18/2016    Priority: Medium  . Seizure (HCC) 09/18/2016    Priority: Medium    . h/o Stroke secondary to vomiting episodes per Neurologist 09/18/2016    Priority: Medium  . h/o SAH (subarachnoid hemorrhage) at birth 09/18/2016    Priority: Medium  . Poor short term memory 09/18/2016    Priority: Medium  . Abnormal brain MRI 12/05/2011    Priority: Low  . Dyspareunia in female 10/21/2018  . Fever 08/10/2018  . Headache, unspecified headache type 08/10/2018  . Nausea without vomiting 08/10/2018  . Abnormal MRI of head 06/02/2018  . Underweight due to inadequate caloric intake 04/20/2018  . Dizziness 04/20/2018  . History of seizure 03/24/2018  . Irregular periods 10/17/2017  . Prolonged periods 10/17/2017  . Reactive airway disease without complication 10/17/2017  . Upper respiratory symptom 09/18/2016  . Neonatal seizures 12/05/2011  . Strabismus 12/05/2011    Past Medical history, Surgical history, Family history, Social history, Allergies and Medications have been entered into the medical record, reviewed and changed as needed.    Current Meds  Medication Sig  .  budesonide (PULMICORT) 180 MCG/ACT inhaler Inhale 1 puff into the lungs daily.  Marland Kitchen desogestrel-ethinyl estradiol (APRI,EMOQUETTE,SOLIA) 0.15-30 MG-MCG tablet Take 1 tablet by mouth daily.  . fluticasone (FLONASE) 50 MCG/ACT nasal spray Place 1 spray into both nostrils daily.  Marland Kitchen levocetirizine (XYZAL) 5 MG tablet Take 1 tablet (5 mg total) by mouth every evening.  . ondansetron (ZOFRAN) 4 MG tablet Take 1 tablet (4 mg total) by mouth every 8 (eight) hours as needed for nausea or vomiting.    Allergies:  Allergies  Allergen Reactions  . Other Other (See Comments)    anaphalaxis with peanuts  . Peanut-Containing Drug Products Anaphylaxis  . Celery Oil Other (See Comments)     Review of Systems:  A fourteen system review of systems was performed and found to be positive as per HPI.   Objective:   Blood pressure 121/85, pulse 85, temperature 97.6 F (36.4 C), height 5\' 3"  (1.6 m), weight  99 lb 11.2 oz (45.2 kg), last menstrual period 10/14/2018, SpO2 100 %. Body mass index is 17.66 kg/m. General:  Well Developed, well nourished, appropriate for stated age.  Neuro:  Alert and oriented,  extra-ocular muscles intact  HEENT:  Normocephalic, atraumatic, neck supple, no carotid bruits appreciated  Skin:  no gross rash, warm, pink. Cardiac:  RRR, S1 S2 Respiratory:  ECTA B/L and A/P, Not using accessory muscles, speaking in full sentences- unlabored. Vascular:  Ext warm, no cyanosis apprec.; cap RF less 2 sec. Psych:  No HI/SI, judgement and insight good, Euthymic mood. Full Affect. Genitalia:  Ext genitalia: without lesion, no rash or discharge, No tenderness;  Cervix: WNL's w/o discharge or lesion; Adnexa:  No tenderness or palpable masses

## 2018-10-21 NOTE — Patient Instructions (Signed)
As discussed if you continue to have pain and all tests are normal, I recommend an ultrasound be done next then if that is normal, or abnormal, you will need to go to a gynecologist for further evaluation and treatment   Pelvic Pain, Female Pelvic pain is pain in your lower belly (abdomen), below your belly button and between your hips. The pain may start suddenly (be acute), keep coming back (be recurring), or last a long time (become chronic). Pelvic pain that lasts longer than 6 months is called chronic pelvic pain. There are many causes of pelvic pain. Sometimes the cause of pelvic pain is not known. Follow these instructions at home:   Take over-the-counter and prescription medicines only as told by your doctor.  Rest as told by your doctor.  Do not have sex if it hurts.  Keep a journal of your pelvic pain. Write down: ? When the pain started. ? Where the pain is located. ? What seems to make the pain better or worse, such as food or your period (menstrual cycle). ? Any symptoms you have along with the pain.  Keep all follow-up visits as told by your doctor. This is important. Contact a doctor if:  Medicine does not help your pain.  Your pain comes back.  You have new symptoms.  You have unusual discharge or bleeding from your vagina.  You have a fever or chills.  You are having trouble pooping (constipation).  You have blood in your pee (urine) or poop (stool).  Your pee smells bad.  You feel weak or light-headed. Get help right away if:  You have sudden pain that is very bad.  Your pain keeps getting worse.  You have very bad pain and also have any of these symptoms: ? A fever. ? Feeling sick to your stomach (nausea). ? Throwing up (vomiting). ? Being very sweaty.  You pass out (lose consciousness). Summary  Pelvic pain is pain in your lower belly (abdomen), below your belly button and between your hips.  There are many possible causes of pelvic  pain.  Keep a journal of your pelvic pain. This information is not intended to replace advice given to you by your health care provider. Make sure you discuss any questions you have with your health care provider. Document Released: 03/18/2008 Document Revised: 03/18/2018 Document Reviewed: 03/18/2018 Elsevier Interactive Patient Education  2019 [employer].

## 2018-10-22 LAB — CYTOLOGY - PAP
BACTERIAL VAGINITIS: NEGATIVE
CHLAMYDIA, DNA PROBE: NEGATIVE
Candida vaginitis: NEGATIVE
Diagnosis: NEGATIVE
NEISSERIA GONORRHEA: NEGATIVE
TRICH (WINDOWPATH): NEGATIVE

## 2018-10-22 LAB — VITAMIN D 25 HYDROXY (VIT D DEFICIENCY, FRACTURES): Vit D, 25-Hydroxy: 12.9 ng/mL — ABNORMAL LOW (ref 30.0–100.0)

## 2018-10-22 LAB — HEMOGLOBIN A1C
Est. average glucose Bld gHb Est-mCnc: 114 mg/dL
HEMOGLOBIN A1C: 5.6 % (ref 4.8–5.6)

## 2018-10-22 LAB — HEPATITIS C ANTIBODY: Hep C Virus Ab: <0.1 s/co ratio (ref 0.0–0.9)

## 2018-10-22 LAB — HIV ANTIBODY (ROUTINE TESTING W REFLEX): HIV Screen 4th Generation wRfx: NONREACTIVE

## 2018-10-22 LAB — RPR: RPR Ser Ql: NONREACTIVE

## 2018-10-26 LAB — CERVICOVAGINAL ANCILLARY ONLY: Herpes: NEGATIVE

## 2018-10-27 ENCOUNTER — Telehealth: Payer: Self-pay | Admitting: Family Medicine

## 2018-10-27 NOTE — Telephone Encounter (Signed)
Patient states Melissa left her a message to call office yesterday.  --glh

## 2018-10-28 NOTE — Telephone Encounter (Signed)
Called patient with lab results. Please see result note. MPulliam, CMA/RT(R)

## 2018-11-04 ENCOUNTER — Ambulatory Visit (INDEPENDENT_AMBULATORY_CARE_PROVIDER_SITE_OTHER): Payer: 59

## 2018-11-04 VITALS — BP 120/78 | HR 94 | Temp 98.2°F | Ht 63.0 in | Wt 100.0 lb

## 2018-11-04 DIAGNOSIS — Z23 Encounter for immunization: Secondary | ICD-10-CM | POA: Diagnosis not present

## 2018-11-04 NOTE — Progress Notes (Signed)
Patient here for flu vaccine.  Patient tolerated injection well. MPulliam, CMA/RT(R)  

## 2018-11-06 ENCOUNTER — Other Ambulatory Visit: Payer: Self-pay | Admitting: Family Medicine

## 2018-12-25 ENCOUNTER — Other Ambulatory Visit: Payer: Self-pay | Admitting: Family Medicine

## 2018-12-28 NOTE — Progress Notes (Signed)
Subjective:    Patient ID: Kathleen Myers, female    DOB: 07-Jun-1996, 23 y.o.   MRN: [id number]  HPI: Ms. Kathleen Myers presents with L ear otalgia (intermittent sharp pain, 5/10), chills, hot flashes, and low grade fever (highest 99.18f oral)- sx's started 48 hrs ago She denies cough/nasal congestion/dyspnea/CP/dizziness/HA/palpitations She denies N/V/D She denies tobacco/vape use She has been using Rx Xyzal and and OTC Ibuprofen- last dose yesterday, current temp 99.73f oral She works in [employer], exposed to children She denies known exposure to Influenza She has not recently traveled outside of [place]  Patient Care Team    Relationship Specialty Notifications Start End  Kathleen Lot, DO PCP - General Family Medicine  09/18/16   Kathleen Abrahams, MD Referring Physician Pediatric Neurology  09/18/16    Comment: 6 yrs ago  Kathleen Stanford, MD Referring Physician Allergy and Immunology  09/18/16   Key, Verita Schneiders, NP Nurse Practitioner Gynecology  09/18/16   Kathleen Carrow, MD Consulting Physician Ophthalmology  09/18/16    Comment: still actively seeing him    Patient Active Problem List   Diagnosis Date Noted  . Left otitis media 12/29/2018  . Dyspareunia in female 10/21/2018  . Fever 08/10/2018  . Headache, unspecified headache type 08/10/2018  . Nausea without vomiting 08/10/2018  . Abnormal MRI of head 06/02/2018  . Underweight due to inadequate caloric intake 04/20/2018  . Dizziness 04/20/2018  . History of seizure 03/24/2018  . Irregular periods 10/17/2017  . Prolonged periods 10/17/2017  . Dysmenorrhea in adolescent 10/17/2017  . Family history of diabetes mellitus (DM) 10/17/2017  . Reactive airway disease without complication 10/17/2017  . Oral contraceptive use- seen by GYN Kathleen Chamber, NP 10/08/2016  . Cortical visual impairment- age 68 09/18/2016  . Seizure (HCC) 09/18/2016  . h/o Stroke secondary to vomiting episodes per Neurologist 09/18/2016  . h/o SAH  (subarachnoid hemorrhage) at birth 09/18/2016  . Poor short term memory 09/18/2016  .  Seasonal and environmental allergies-txed by Dr Barnetta Chapel 09/18/2016  . Upper respiratory symptom 09/18/2016  . Abnormal brain MRI 12/05/2011  . Borderline intellectual disability 12/05/2011  . Neonatal seizures 12/05/2011  . Strabismus 12/05/2011     Past Medical History:  Diagnosis Date  . Allergy   . Cortical visual impairment   . 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   - white matter L side of frontal cortex  . SAH (subarachnoid hemorrhage) (HCC)   . Seizures (HCC)   . Stroke [facility])      Past Surgical History:  Procedure Laterality Date  . EYE SURGERY    . STRABISMUS SURGERY       Family History  Problem Relation Age of Onset  . Cancer Maternal Grandmother        Uterin, skin  . Depression Maternal Grandmother   . Diabetes Maternal Grandmother   . Hyperlipidemia Maternal Grandmother   . Hypertension Maternal Grandmother   . Diabetes Maternal Grandfather   . Hyperlipidemia Maternal Grandfather   . Hypertension Maternal Grandfather   . Other Maternal Grandfather        agent orange  . Depression Paternal Grandmother   . Hyperlipidemia Paternal Grandfather   . Hypertension Paternal Grandfather   . Healthy Mother   . Healthy Father      Social History   Substance and Sexual Activity  Drug Use No     Social History  Substance and Sexual Activity  Alcohol Use No     Social History   Tobacco Use  Smoking Status Never Smoker  Smokeless Tobacco Never Used     Outpatient Encounter Medications as of 12/29/2018  Medication Sig  . budesonide (PULMICORT) 180 MCG/ACT inhaler Inhale 1 puff into the lungs daily.  Marland Kitchen desogestrel-ethinyl estradiol (APRI,EMOQUETTE,SOLIA) 0.15-30 MG-MCG tablet Take 1 tablet by mouth daily.  . fluticasone (FLONASE) 50 MCG/ACT nasal spray USE 1 SPRAY INTO BOTH NOSTRILS  DAILY  . levocetirizine (XYZAL) 5 MG tablet TAKE 1 TABLET BY MOUTH EVERY EVENING  . ondansetron (ZOFRAN) 4 MG tablet Take 1 tablet (4 mg total) by mouth every 8 (eight) hours as needed for nausea or vomiting.  Marland Kitchen amoxicillin-clavulanate (AUGMENTIN) 500-125 MG tablet Take 1 tablet (500 mg total) by mouth 2 (two) times daily.   No facility-administered encounter medications on file as of 12/29/2018.     Allergies: Other; Peanut-containing drug products; and Celery oil  Body mass index is 18.48 kg/m.  Blood pressure 119/81, pulse (!) 119, temperature 99.2 F (37.3 C), temperature source Oral, height 5\' 3"  (1.6 m), weight 104 lb 4.8 oz (47.3 kg), last menstrual period 12/09/2018, SpO2 95 %.  Review of Systems  Constitutional: Positive for appetite change, chills, fatigue and fever. Negative for activity change, diaphoresis and unexpected weight change.  HENT: Positive for ear pain and facial swelling. Negative for congestion, hearing loss, postnasal drip, rhinorrhea, sinus pressure, sinus pain, sneezing, sore throat and trouble swallowing.   Eyes: Negative for visual disturbance.  Respiratory: Negative for cough, chest tightness, shortness of breath, wheezing and stridor.   Cardiovascular: Negative for chest pain, palpitations and leg swelling.  Gastrointestinal: Negative for abdominal distention, abdominal pain, blood in stool, constipation, diarrhea, nausea and vomiting.  Endocrine: Negative for cold intolerance, heat intolerance, polydipsia, polyphagia and polyuria.  Genitourinary: Negative for difficulty urinating and flank pain.  Neurological: Negative for dizziness and headaches.  Hematological: Does not bruise/bleed easily.       Objective:   Physical Exam Vitals signs and nursing note reviewed.  Constitutional:      Appearance: She is normal weight. She is ill-appearing.  HENT:     Head: Normocephalic and atraumatic.     Right Ear: Hearing and external ear normal. No decreased  hearing noted. Tympanic membrane is bulging. Tympanic membrane is not erythematous.     Left Ear: Hearing and external ear normal. No decreased hearing noted. Tympanic membrane is erythematous and bulging.     Nose: Rhinorrhea present.     Right Turbinates: Not swollen.     Left Turbinates: Not swollen.     Right Sinus: No maxillary sinus tenderness or frontal sinus tenderness.     Left Sinus: No maxillary sinus tenderness or frontal sinus tenderness.     Mouth/Throat:     Pharynx: Posterior oropharyngeal erythema present. No oropharyngeal exudate.     Tonsils: No tonsillar exudate. Swelling: 0 on the right. 0 on the left.  Eyes:     Conjunctiva/sclera: Conjunctivae normal.     Pupils: Pupils are equal, round, and reactive to light.  Neck:     Musculoskeletal: Normal range of motion.  Cardiovascular:     Rate and Rhythm: Tachycardia present.     Pulses: Normal pulses.     Heart sounds: Normal heart sounds. No murmur. No gallop.   Pulmonary:     Effort: Pulmonary effort is normal. No respiratory distress.     Breath sounds: Normal breath sounds. No  stridor. No wheezing, rhonchi or rales.  Chest:     Chest wall: No tenderness.  Lymphadenopathy:     Cervical: No cervical adenopathy.  Skin:    General: Skin is warm and dry.     Capillary Refill: Capillary refill takes less than 2 seconds.     Comments: Very warm to the touch Flushed appearance   Neurological:     Mental Status: She is alert and oriented to person, place, and time.  Psychiatric:        Mood and Affect: Mood normal.        Behavior: Behavior normal.        Thought Content: Thought content normal.        Judgment: Judgment normal.       Assessment & Plan:   1. Left otitis media, unspecified otitis media type     Left otitis media Please take Augmentin as directed. Continue to drink plenty of fluids. Alternate OTC Acetaminophen and Ibuprofen as needed for fever for discomfort. If symptoms persist after  antibiotic completed, please call clinic. Work Excuse provided    FOLLOW-UP:  Return if symptoms worsen or fail to improve.

## 2018-12-29 ENCOUNTER — Ambulatory Visit (INDEPENDENT_AMBULATORY_CARE_PROVIDER_SITE_OTHER): Payer: 59 | Admitting: Adult Health

## 2018-12-29 ENCOUNTER — Encounter: Payer: Self-pay | Admitting: Family Medicine

## 2018-12-29 ENCOUNTER — Encounter: Payer: Self-pay | Admitting: Adult Health

## 2018-12-29 ENCOUNTER — Other Ambulatory Visit: Payer: Self-pay

## 2018-12-29 VITALS — BP 119/81 | HR 119 | Temp 99.2°F | Ht 63.0 in | Wt 104.3 lb

## 2018-12-29 DIAGNOSIS — H6692 Otitis media, unspecified, left ear: Secondary | ICD-10-CM | POA: Insufficient documentation

## 2018-12-29 MED ORDER — AMOXICILLIN-POT CLAVULANATE 500-125 MG PO TABS: 1.0000 | ORAL_TABLET | Freq: Two times a day (BID) | ORAL | 0 refills | Status: DC

## 2018-12-29 NOTE — Patient Instructions (Signed)
Otitis Media, Adult  Otitis media occurs when there is inflammation and fluid in the middle ear. Your middle ear is a part of the ear that contains bones for hearing as well as air that helps send sounds to your brain. What are the causes? This condition is caused by a blockage in the eustachian tube. This tube drains fluid from the ear to the back of the nose (nasopharynx). A blockage in this tube can be caused by an object or by swelling (edema) in the tube. Problems that can cause a blockage include:  A cold or other upper respiratory infection.  Allergies.  An irritant, such as tobacco smoke.  Enlarged adenoids. The adenoids are areas of soft tissue located high in the back of the throat, behind the nose and the roof of the mouth.  A mass in the nasopharynx.  Damage to the ear caused by pressure changes (barotrauma). What are the signs or symptoms? Symptoms of this condition include:  Ear pain.  A fever.  Decreased hearing.  A headache.  Tiredness (lethargy).  Fluid leaking from the ear.  Ringing in the ear. How is this diagnosed? This condition is diagnosed with a physical exam. During the exam your health care provider will use an instrument called an otoscope to look into your ear and check for redness, swelling, and fluid. He or she will also ask about your symptoms. Your health care provider may also order tests, such as:  A test to check the movement of the eardrum (pneumatic otoscopy). This test is done by squeezing a small amount of air into the ear.  A test that changes air pressure in the middle ear to check how well the eardrum moves and whether the eustachian tube is working (tympanogram). How is this treated? This condition usually goes away on its own within 3-5 days. But if the condition is caused by a bacteria infection and does not go away own its own, or keeps coming back, your health care provider may:  Prescribe antibiotic medicines to treat the  infection.  Prescribe or recommend medicines to control pain. Follow these instructions at home:  Take over-the-counter and prescription medicines only as told by your health care provider.  If you were prescribed an antibiotic medicine, take it as told by your health care provider. Do not stop taking the antibiotic even if you start to feel better.  Keep all follow-up visits as told by your health care provider. This is important. Contact a health care provider if:  You have bleeding from your nose.  There is a lump on your neck.  You are not getting better in 5 days.  You feel worse instead of better. Get help right away if:  You have severe pain that is not controlled with medicine.  You have swelling, redness, or pain around your ear.  You have stiffness in your neck.  A part of your face is paralyzed.  The bone behind your ear (mastoid) is tender when you touch it.  You develop a severe headache. Summary  Otitis media is redness, soreness, and swelling of the middle ear.  This condition usually goes away on its own within 3-5 days.  If the problem does not go away in 3-5 days, your health care provider may prescribe or recommend medicines to treat your symptoms.  If you were prescribed an antibiotic medicine, take it as told by your health care provider. This information is not intended to replace advice given to you by   your health care provider. Make sure you discuss any questions you have with your health care provider. Document Released: 07/05/2004 Document Revised: 09/20/2016 Document Reviewed: 09/20/2016 Elsevier Interactive Patient Education  2019 [employer] (COVID-19) Are you at risk?  Are you at risk for the Coronavirus (COVID-19)?  To be considered HIGH RISK for Coronavirus (COVID-19), you have to meet the following criteria:  . Traveled to [place], [place], [place], [place] or [place]; or in the [place] to [place], [place], [place], or [place]; and have fever, cough, and shortness of breath within the last 2 weeks of travel OR . Been in close contact with a person diagnosed with COVID-19 within the last 2 weeks and have fever, cough, and shortness of breath . IF YOU DO NOT MEET THESE CRITERIA, YOU ARE CONSIDERED LOW RISK FOR COVID-19.  What to do if you are HIGH RISK for COVID-19?  Marland Kitchen If you are having a medical emergency, call 911. . Seek medical care right away. Before you go to a doctor's office, urgent care or emergency department, call ahead and tell them about your recent travel, contact with someone diagnosed with COVID-19, and your symptoms. You should receive instructions from your physician's office regarding next steps of care.  . When you arrive at healthcare provider, tell the healthcare staff immediately you have returned from visiting [place], [place], [place], [place] or [place]; or traveled in the [place] to [place], [place], [place], or [place]; in the last two weeks or you have been in close contact with a person diagnosed with COVID-19 in the last 2 weeks.   . Tell the health care staff about your symptoms: fever, cough and shortness of breath. . After you have been seen by a medical provider, you will be either: o Tested for (COVID-19) and discharged home on quarantine except to seek medical care if symptoms worsen, and asked to  - Stay home and avoid contact with others until you get your results (4-5 days)  - Avoid travel on public transportation if possible (such as bus, train, or airplane) or o Sent to the Emergency Department by EMS for evaluation, COVID-19 testing, and possible admission depending on your condition and test results.  What to do if you are LOW RISK for COVID-19?  Reduce your risk of any infection by using the same precautions used for avoiding the common cold or flu:  Marland Kitchen Wash your hands often with soap and warm water for at least 20 seconds.  If soap and water are  not readily available, use an alcohol-based hand sanitizer with at least 60% alcohol.  . If coughing or sneezing, cover your mouth and nose by coughing or sneezing into the elbow areas of your shirt or coat, into a tissue or into your sleeve (not your hands). . Avoid shaking hands with others and consider head nods or verbal greetings only. . Avoid touching your eyes, nose, or mouth with unwashed hands.  . Avoid close contact with people who are sick. . Avoid places or events with large numbers of people in one location, like concerts or sporting events. . Carefully consider travel plans you have or are making. . If you are planning any travel outside or inside the [place], visit the CDC's Travelers' Health webpage for the latest health notices. . If you have some symptoms but not all symptoms, continue to monitor at home and seek medical attention if your symptoms worsen. . If you are  having a medical emergency, call 911.   ADDITIONAL HEALTHCARE OPTIONS FOR PATIENTS  [facility] / e-Visit: [email or web address]         [facility]: [phone]  [facility]: [phone]                   [facility]: [phone]   Please take Augmentin as directed. Continue to drink plenty of fluids. Alternate OTC Acetaminophen and Ibuprofen as needed for fever for discomfort. If symptoms persist after antibiotic completed, please call clinic. FEEL BETTER!

## 2018-12-29 NOTE — Assessment & Plan Note (Signed)
Please take Augmentin as directed. Continue to drink plenty of fluids. Alternate OTC Acetaminophen and Ibuprofen as needed for fever for discomfort. If symptoms persist after antibiotic completed, please call clinic. Work Engineer, production provided

## 2019-04-21 ENCOUNTER — Ambulatory Visit (INDEPENDENT_AMBULATORY_CARE_PROVIDER_SITE_OTHER): Payer: 59 | Admitting: Family Medicine

## 2019-04-21 ENCOUNTER — Encounter: Payer: Self-pay | Admitting: Family Medicine

## 2019-04-21 ENCOUNTER — Other Ambulatory Visit: Payer: Self-pay

## 2019-04-21 VITALS — BP 102/72 | HR 75 | Temp 98.5°F | Ht 63.0 in | Wt 106.7 lb

## 2019-04-21 DIAGNOSIS — R221 Localized swelling, mass and lump, neck: Secondary | ICD-10-CM

## 2019-04-21 DIAGNOSIS — Z719 Counseling, unspecified: Secondary | ICD-10-CM

## 2019-04-21 DIAGNOSIS — E559 Vitamin D deficiency, unspecified: Secondary | ICD-10-CM | POA: Diagnosis not present

## 2019-04-21 DIAGNOSIS — Z0000 Encounter for general adult medical examination without abnormal findings: Secondary | ICD-10-CM

## 2019-04-21 DIAGNOSIS — R899 Unspecified abnormal finding in specimens from other organs, systems and tissues: Secondary | ICD-10-CM | POA: Diagnosis not present

## 2019-04-21 MED ORDER — VITAMIN D3 125 MCG (5000 UT) PO TABS: ORAL_TABLET | ORAL | 3 refills | Status: DC

## 2019-04-21 NOTE — Progress Notes (Signed)
Impression and Recommendations:    1. Encounter for wellness examination   2. Health education/counseling   3. Vitamin D deficiency   4. Abnormal laboratory test result- low A1c   5. enlrged SCM muscle left side of neck- fullness just lat to L lobe of thyroid    1) Anticipatory Guidance: Discussed importance of wearing a seatbelt while driving, not texting while driving; sunscreen when outside along with yearly skin surveillance; eating a well balanced and modest diet; physical activity at least 25 minutes per day or 150 min/ week of moderate to intense activity.  2) Immunizations / Screenings / Labs:  All immunizations and screenings that patient agrees to, are up-to-date per recommendations or will be updated today.  Patient understands the needs for q 42modental and yearly vision screens which pt will schedule independently. Obtain CBC, CMP, HgA1c, Lipid panel, TSH and vit D when fasting if not already done recently.   3) Weight:   Discussed goal of gaining weight since she feels too thin/week.  Discussed with patient increasing caloric intake of healthy calories.   improve nutrient density of diet through increasing intake of fruits and vegetables and decreasing saturated/trans fats, white flour products and refined sugar products.   4) left neck fullness.  We will recheck physical exam of her left neck just lateral to left thyroid gland in 3 months.  TSH, T4 and other labs will be obtained today  5) for low vitamin D-told patient AGAIN to take 5000 IUs of vitamin D3 daily  No orders of the defined types were placed in this encounter.   No orders of the defined types were placed in this encounter.   Gross side effects, risk and benefits, and alternatives of medications discussed with patient.  Patient is aware that all medications have potential side effects and we are unable to predict every side effect or drug-drug interaction that may occur.  Expresses verbal understanding  and consents to current therapy plan and treatment regimen.  F-up preventative CPE in 1 year. F/up sooner for chronic care management as discussed and/or prn.  Please see orders placed and AVS handed out to patient at the end of our visit for further patient instructions/ counseling done pertaining to today's office visit.   [name] DO 8:51 AM 04/21/2019    Subjective:   No chief complaint on file.  CC: None  HPI: Kathleen Myers is a 23y.o. [facility].  Health Maintenance Summary Reviewed and updated, unless pt declines services.  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  Full STD panel done as well which was normal/  negative Lumps or breast concerns:      no Breast Cancer Family History:      Not in first degree relative  Immunization History  Administered Date(s) Administered  . DTaP 01/20/1998, 03/24/1998, 06/21/1998, 08/08/1999, 01/27/2003  . Hepatitis A 04/20/2008, 10/21/2008  .  Hepatitis B 07-30-1998, 01/19/1998, 06/21/1998  . HiB (PRP-OMP) 01/20/1998, 03/24/1998, 06/21/1998, 12/06/1998  . Hpv 04/20/2008, 06/21/2008, 10/21/2008  . IPV 01/20/1998, 03/24/1998, 08/08/1999, 01/27/2003  . Influenza,inj,Quad PF,6+ Mos 09/18/2016, 11/04/2018  . MMR 03/16/1999, 01/27/2003  . Meningococcal Conjugate 11/26/2010, 05/18/2015  . Rotavirus Monovalent 01/20/1998  . Tdap 06/06/2009  . Varicella 12/06/1998, 04/20/2008     Health Maintenance  Topic Date Due  . INFLUENZA VACCINE  05/15/2019  . TETANUS/TDAP  06/07/2019  . PAP-Cervical Cytology Screening  10/21/2021  . PAP SMEAR-Modifier  10/21/2021  . HIV Screening  Completed     Wt Readings from Last 3 Encounters:  04/21/19 106 lb 11.2 oz (48.4 kg)  12/29/18 104 lb 4.8 oz (47.3 kg)  11/04/18 100 lb (45.4 kg)   BP Readings from Last 3 Encounters:  04/21/19 102/72  12/29/18 119/81  11/04/18 120/78   Pulse Readings from Last 3 Encounters:  04/21/19 75  12/29/18 (!) 119  11/04/18 94     Past Medical History:  Diagnosis Date  . Allergy   . Cortical visual impairment   . 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   - white matter L side of frontal cortex  . SAH (subarachnoid hemorrhage) ([place])   . Seizures ([place])   . Stroke [facility])       Past Surgical History:  Procedure Laterality Date  . EYE SURGERY    . STRABISMUS SURGERY        Family History  Problem Relation Age of Onset  . Cancer Maternal Grandmother        Uterin, skin  . Depression Maternal Grandmother   . Diabetes Maternal Grandmother   . Hyperlipidemia Maternal Grandmother   . Hypertension Maternal Grandmother   . Diabetes Maternal Grandfather   . Hyperlipidemia Maternal Grandfather   . Hypertension Maternal Grandfather   . Other Maternal Grandfather        agent orange  . Depression Paternal Grandmother   . Hyperlipidemia Paternal Grandfather   . Hypertension Paternal Grandfather   . Healthy Mother   . Healthy Father       Social History   Substance and Sexual Activity  Drug Use No  ,   Social History   Substance and Sexual Activity  Alcohol Use No  ,   Social History   Tobacco Use  Smoking Status Never Smoker  Smokeless Tobacco Never Used  ,   Social History   Substance and Sexual Activity  Sexual Activity Not Currently  . Birth control/protection: Pill    Current  Outpatient Medications on File Prior to Visit  Medication Sig Dispense Refill  . budesonide (PULMICORT) 180 MCG/ACT inhaler Inhale 1 puff into the lungs daily. 1 Inhaler 11  . desogestrel-ethinyl estradiol (APRI,EMOQUETTE,SOLIA) 0.15-30 MG-MCG tablet Take 1 tablet by mouth daily.    . fluticasone (FLONASE) 50 MCG/ACT nasal spray USE 1 SPRAY INTO BOTH NOSTRILS DAILY 16 g 11  . levocetirizine (XYZAL) 5 MG tablet TAKE 1 TABLET BY MOUTH EVERY EVENING 30 tablet 11  . ondansetron (ZOFRAN) 4 MG tablet Take 1 tablet (4 mg total) by mouth every 8 (eight) hours as needed for nausea or vomiting. 20 tablet 0   No current facility-administered medications on file prior to visit.     Allergies: Other, Peanut-containing drug products, and Celery oil  Review of Systems: General:   Denies fever, chills, unexplained weight loss.  Optho/Auditory:   Denies visual  changes, blurred vision/LOV Respiratory:   Denies SOB, DOE more than baseline levels.  Cardiovascular:   Denies chest pain, palpitations, new onset peripheral edema  Gastrointestinal:   Denies nausea, vomiting, diarrhea.  Genitourinary: Denies dysuria, freq/ urgency, flank pain or discharge from genitals.  Endocrine:     Denies hot or cold intolerance, polyuria, polydipsia. Musculoskeletal:   Denies unexplained myalgias, joint swelling, unexplained arthralgias, gait problems.  Skin:  Denies rash, suspicious lesions Neurological:     Denies dizziness, unexplained weakness, numbness  Psychiatric/Behavioral:   Denies mood changes, suicidal or homicidal ideations, hallucinations    Objective:    Blood pressure 102/72, pulse 75, temperature 98.5 F (36.9 C), height _0  (1.6 m), weight 106 lb 11.2 oz (48.4 kg), last menstrual period 04/07/2019, SpO2 99 %. Body mass index is 18.9 kg/m. General Appearance:    Alert, cooperative, no distress, appears stated age  Head:    Normocephalic, without obvious abnormality, atraumatic  Eyes:    PERRL,  conjunctiva/corneas clear, EOM's intact, fundi    benign, both eyes  Ears:    Normal TM's and external ear canals, both ears  Nose:   Nares normal, septum midline, mucosa normal, no drainage    or sinus tenderness  Throat:   Lips w/o lesion, mucosa moist, and tongue normal; teeth and   gums normal  Neck:   Supple, symmetrical, trachea midline, no adenopathy;    thyroid:  no enlargement/tenderness/nodules, however just lateral to her left lobe of her thyroid is a fullness which appears to be swelling of the sternocleidomastoid muscle belly.; no carotid   bruit or JVD  Back:     Symmetric, no curvature, ROM normal, no CVA tenderness  Lungs:     Clear to auscultation bilaterally, respirations unlabored, no       Wh/ R/ R  Chest Wall:    No tenderness or gross deformity; normal excursion   Heart:    Regular rate and rhythm, S1 and S2 normal, no murmur, rub   or gallop  Breast Exam:    No tenderness, masses, or nipple abnormality b/l; no d/c  Abdomen:     Soft, non-tender, bowel sounds active all four quadrants, NO   G/R/R, no masses, no organomegaly  Genitalia:   Deferred exam done January 2020  Rectal:   Deferred  Extremities:   Extremities normal, atraumatic, no cyanosis or gross edema  Pulses:   2+ and symmetric all extremities  Skin:   Warm, dry, Skin color, texture, turgor normal, no obvious rashes or lesions Psych: No HI/SI, judgement and insight good, Euthymic mood. Full Affect.  Neurologic:  normal strength, sensation and reflexes    Throughout

## 2019-04-21 NOTE — Patient Instructions (Addendum)
Your last Pap smear and STD screening tests were done early January 2020 you will be due January 2021     Preventive Care for Adults, Female  A healthy lifestyle and preventive care can promote health and wellness. Preventive health guidelines for women include the following key practices.   A routine yearly physical is a good way to check with your health care provider about your health and preventive screening. It is a chance to share any concerns and updates on your health and to receive a thorough exam.   Visit your dentist for a routine exam and preventive care every 6 months. Brush your teeth twice a day and floss once a day. Good oral hygiene prevents tooth decay and gum disease.   The frequency of eye exams is based on your age, health, family medical history, use of contact lenses, and other factors. Follow your health care provider's recommendations for frequency of eye exams.   Eat a healthy diet. Foods like vegetables, fruits, whole grains, low-fat dairy products, and lean protein foods contain the nutrients you need without too many calories. Decrease your intake of foods high in solid fats, added sugars, and salt. Eat the right amount of calories for you.Get information about a proper diet from your health care provider, if necessary.   Regular physical exercise is one of the most important things you can do for your health. Most adults should get at least 150 minutes of moderate-intensity exercise (any activity that increases your heart rate and causes you to sweat) each week. In addition, most adults need muscle-strengthening exercises on 2 or more days a week.   Maintain a healthy weight. The body mass index (BMI) is a screening tool to identify possible weight problems. It provides an estimate of body fat based on height and weight. Your health care provider can find your BMI, and can help you achieve or maintain a healthy weight.For adults 20 years and older:   - A  BMI below 18.5 is considered underweight.   - A BMI of 18.5 to 24.9 is normal.   - A BMI of 25 to 29.9 is considered overweight.   - A BMI of 30 and above is considered obese.   Maintain normal blood lipids and cholesterol levels by exercising and minimizing your intake of trans and saturated fats.  Eat a balanced diet with plenty of fruit and vegetables. Blood tests for lipids and cholesterol should begin at age 36 and be repeated every 5 years minimum.  If your lipid or cholesterol levels are high, you are over 40, or you are at high risk for heart disease, you may need your cholesterol levels checked more frequently.Ongoing high lipid and cholesterol levels should be treated with medicines if diet and exercise are not working.   If you smoke, find out from your health care provider how to quit. If you do not use tobacco, do not start.   Lung cancer screening is recommended for adults aged 27-80 years who are at high risk for developing lung cancer because of a history of smoking. A yearly low-dose CT scan of the lungs is recommended for people who have at least a 30-pack-year history of smoking and are a current smoker or have quit within the past 15 years. A pack year of smoking is smoking an average of 1 pack of cigarettes a day for 1 year (for example: 1 pack a day for 30 years or 2 packs a day for 15 years). Yearly screening  should continue until the smoker has stopped smoking for at least 15 years. Yearly screening should be stopped for people who develop a health problem that would prevent them from having lung cancer treatment.   If you are pregnant, do not drink alcohol. If you are breastfeeding, be very cautious about drinking alcohol. If you are not pregnant and choose to drink alcohol, do not have more than 1 drink per day. One drink is considered to be 12 ounces (355 mL) of beer, 5 ounces (148 mL) of wine, or 1.5 ounces (44 mL) of liquor.   Avoid use of street drugs. Do not share  needles with anyone. Ask for help if you need support or instructions about stopping the use of drugs.   High blood pressure causes heart disease and increases the risk of stroke. Your blood pressure should be checked at least yearly.  Ongoing high blood pressure should be treated with medicines if weight loss and exercise do not work.   If you are 21-43 years old, ask your health care provider if you should take aspirin to prevent strokes.   Diabetes screening involves taking a blood sample to check your fasting blood sugar level. This should be done once every 3 years, after age 34, if you are within normal weight and without risk factors for diabetes. Testing should be considered at a younger age or be carried out more frequently if you are overweight and have at least 1 risk factor for diabetes.   Breast cancer screening is essential preventive care for women. You should practice "breast self-awareness."  This means understanding the normal appearance and feel of your breasts and may include breast self-examination.  Any changes detected, no matter how small, should be reported to a health care provider.  Women in their 78s and 30s should have a clinical breast exam (CBE) by a health care provider as part of a regular health exam every 1 to 3 years.  After age 81, women should have a CBE every year.  Starting at age 41, women should consider having a mammogram (breast X-ray test) every year.  Women who have a family history of breast cancer should talk to their health care provider about genetic screening.  Women at a high risk of breast cancer should talk to their health care providers about having an MRI and a mammogram every year.   -Breast cancer gene (BRCA)-related cancer risk assessment is recommended for women who have family members with BRCA-related cancers. BRCA-related cancers include breast, ovarian, tubal, and peritoneal cancers. Having family members with these cancers may be  associated with an increased risk for harmful changes (mutations) in the breast cancer genes BRCA1 and BRCA2. Results of the assessment will determine the need for genetic counseling and BRCA1 and BRCA2 testing.   The Pap test is a screening test for cervical cancer. A Pap test can show cell changes on the cervix that might become cervical cancer if left untreated. A Pap test is a procedure in which cells are obtained and examined from the lower end of the uterus (cervix).   - Women should have a Pap test starting at age 104.   - Between ages 62 and 50, Pap tests should be repeated every 2 years.   - Beginning at age 45, you should have a Pap test every 3 years as long as the past 3 Pap tests have been normal.   - Some women have medical problems that increase the chance of getting cervical  cancer. Talk to your health care provider about these problems. It is especially important to talk to your health care provider if a new problem develops soon after your last Pap test. In these cases, your health care provider may recommend more frequent screening and Pap tests.   - The above recommendations are the same for women who have or have not gotten the vaccine for human papillomavirus (HPV).   - If you had a hysterectomy for a problem that was not cancer or a condition that could lead to cancer, then you no longer need Pap tests. Even if you no longer need a Pap test, a regular exam is a good idea to make sure no other problems are starting.   - If you are between ages 33 and 22 years, and you have had normal Pap tests going back 10 years, you no longer need Pap tests. Even if you no longer need a Pap test, a regular exam is a good idea to make sure no other problems are starting.   - If you have had past treatment for cervical cancer or a condition that could lead to cancer, you need Pap tests and screening for cancer for at least 20 years after your treatment.   - If Pap tests have been  discontinued, risk factors (such as a new sexual partner) need to be reassessed to determine if screening should be resumed.   - The HPV test is an additional test that may be used for cervical cancer screening. The HPV test looks for the virus that can cause the cell changes on the cervix. The cells collected during the Pap test can be tested for HPV. The HPV test could be used to screen women aged 72 years and older, and should be used in women of any age who have unclear Pap test results. After the age of 88, women should have HPV testing at the same frequency as a Pap test.   Colorectal cancer can be detected and often prevented. Most routine colorectal cancer screening begins at the age of 63 years and continues through age 30 years. However, your health care provider may recommend screening at an earlier age if you have risk factors for colon cancer. On a yearly basis, your health care provider may provide home test kits to check for hidden blood in the stool.  Use of a small camera at the end of a tube, to directly examine the colon (sigmoidoscopy or colonoscopy), can detect the earliest forms of colorectal cancer. Talk to your health care provider about this at age 65, when routine screening begins. Direct exam of the colon should be repeated every 5 -10 years through age 29 years, unless early forms of pre-cancerous polyps or small growths are found.   People who are at an increased risk for hepatitis B should be screened for this virus. You are considered at high risk for hepatitis B if:  -You were born in a country where hepatitis B occurs often. Talk with your health care provider about which countries are considered high risk.  - Your parents were born in a high-risk country and you have not received a shot to protect against hepatitis B (hepatitis B vaccine).  - You have HIV or AIDS.  - You use needles to inject street drugs.  - You live with, or have sex with, someone who has Hepatitis  B.  - You get hemodialysis treatment.  - You take certain medicines for conditions like cancer, organ  transplantation, and autoimmune conditions.   Hepatitis C blood testing is recommended for all people born from [birth year] through 1963 and any individual with known risks for hepatitis C.   Practice safe sex. Use condoms and avoid high-risk sexual practices to reduce the spread of sexually transmitted infections (STIs). STIs include gonorrhea, chlamydia, syphilis, trichomonas, herpes, HPV, and human immunodeficiency virus (HIV). Herpes, HIV, and HPV are viral illnesses that have no cure. They can result in disability, cancer, and death. Sexually active women aged 22 years and younger should be checked for chlamydia. Older women with new or multiple partners should also be tested for chlamydia. Testing for other STIs is recommended if you are sexually active and at increased risk.   Osteoporosis is a disease in which the bones lose minerals and strength with aging. This can result in serious bone fractures or breaks. The risk of osteoporosis can be identified using a bone density scan. Women ages 60 years and over and women at risk for fractures or osteoporosis should discuss screening with their health care providers. Ask your health care provider whether you should take a calcium supplement or vitamin D to There are also several preventive steps women can take to avoid osteoporosis and resulting fractures or to keep osteoporosis from worsening. -->Recommendations include:  Eat a balanced diet high in fruits, vegetables, calcium, and vitamins.  Get enough calcium. The recommended total intake of is 1,200 mg daily; for best absorption, if taking supplements, divide doses into 250-500 mg doses throughout the day. Of the two types of calcium, calcium carbonate is best absorbed when taken with food but calcium citrate can be taken on an empty stomach.  Get enough vitamin D. N[employer] and the [place] recommend at least 1,000 IU per day for women age 40 and over who are at risk of vitamin D deficiency. Vitamin D deficiency can be caused by inadequate sun exposure (for example, those who live in [place]).  Avoid alcohol and smoking. Heavy alcohol intake (more than 7 drinks per week) increases the risk of falls and hip fracture and women smokers tend to lose bone more rapidly and have lower bone mass than nonsmokers. Stopping smoking is one of the most important changes women can make to improve their health and decrease risk for disease.  Be physically active every day. Weight-bearing exercise (for example, fast walking, hiking, jogging, and weight training) may strengthen bones or slow the rate of bone loss that comes with aging. Balancing and muscle-strengthening exercises can reduce the risk of falling and fracture.  Consider therapeutic medications. Currently, several types of effective drugs are available. Healthcare providers can recommend the type most appropriate for each woman.  Eliminate environmental factors that may contribute to accidents. Falls cause nearly 90% of all osteoporotic fractures, so reducing this risk is an important bone-health strategy. Measures include ample lighting, removing obstructions to walking, using nonskid rugs on floors, and placing mats and/or grab bars in showers.  Be aware of medication side effects. Some common medicines make bones weaker. These include a type of steroid drug called glucocorticoids used for arthritis and asthma, some antiseizure drugs, certain sleeping pills, treatments for endometriosis, and some cancer drugs. An overactive thyroid gland or using too much thyroid hormone for an underactive thyroid can also be a problem. If you are taking these medicines, talk to your doctor about what you can do to help protect your bones.reduce the rate of osteoporosis.    Menopause can be associated with  physical  symptoms and risks. Hormone replacement therapy is available to decrease symptoms and risks. You should talk to your health care provider about whether hormone replacement therapy is right for you.   Use sunscreen. Apply sunscreen liberally and repeatedly throughout the day. You should seek shade when your shadow is shorter than you. Protect yourself by wearing long sleeves, pants, a wide-brimmed hat, and sunglasses year round, whenever you are outdoors.   Once a month, do a whole body skin exam, using a mirror to look at the skin on your back. Tell your health care provider of new moles, moles that have irregular borders, moles that are larger than a pencil eraser, or moles that have changed in shape or color.   -Stay current with required vaccines (immunizations).   Influenza vaccine. All adults should be immunized every year.  Tetanus, diphtheria, and acellular pertussis (Td, Tdap) vaccine. Pregnant women should receive 1 dose of Tdap vaccine during each pregnancy. The dose should be obtained regardless of the length of time since the last dose. Immunization is preferred during the 27th 36th week of gestation. An adult who has not previously received Tdap or who does not know her vaccine status should receive 1 dose of Tdap. This initial dose should be followed by tetanus and diphtheria toxoids (Td) booster doses every 10 years. Adults with an unknown or incomplete history of completing a 3-dose immunization series with Td-containing vaccines should begin or complete a primary immunization series including a Tdap dose. Adults should receive a Td booster every 10 years.  Varicella vaccine. An adult without evidence of immunity to varicella should receive 2 doses or a second dose if she has previously received 1 dose. Pregnant females who do not have evidence of immunity should receive the first dose after pregnancy. This first dose should be obtained before leaving the health care facility. The  second dose should be obtained 4 8 weeks after the first dose.  Human papillomavirus (HPV) vaccine. Females aged 66 26 years who have not received the vaccine previously should obtain the 3-dose series. The vaccine is not recommended for use in pregnant females. However, pregnancy testing is not needed before receiving a dose. If a female is found to be pregnant after receiving a dose, no treatment is needed. In that case, the remaining doses should be delayed until after the pregnancy. Immunization is recommended for any person with an immunocompromised condition through the age of 41 years if she did not get any or all doses earlier. During the 3-dose series, the second dose should be obtained 4 8 weeks after the first dose. The third dose should be obtained 24 weeks after the first dose and 16 weeks after the second dose.  Zoster vaccine. One dose is recommended for adults aged 89 years or older unless certain conditions are present.  Measles, mumps, and rubella (MMR) vaccine. Adults born before [birth year] generally are considered immune to measles and mumps. Adults born in [birth year] or later should have 1 or more doses of MMR vaccine unless there is a contraindication to the vaccine or there is laboratory evidence of immunity to each of the three diseases. A routine second dose of MMR vaccine should be obtained at least 28 days after the first dose for students attending postsecondary schools, health care workers, or international travelers. People who received inactivated measles vaccine or an unknown type of measles vaccine during 1963 1967 should receive 2 doses of MMR vaccine. People who received inactivated mumps vaccine or  an unknown type of mumps vaccine before 1979 and are at high risk for mumps infection should consider immunization with 2 doses of MMR vaccine. For females of childbearing age, rubella immunity should be determined. If there is no evidence of immunity, females who are not pregnant should be  vaccinated. If there is no evidence of immunity, females who are pregnant should delay immunization until after pregnancy. Unvaccinated health care workers born before [birth year] who lack laboratory evidence of measles, mumps, or rubella immunity or laboratory confirmation of disease should consider measles and mumps immunization with 2 doses of MMR vaccine or rubella immunization with 1 dose of MMR vaccine.  Pneumococcal 13-valent conjugate (PCV13) vaccine. When indicated, a person who is uncertain of her immunization history and has no record of immunization should receive the PCV13 vaccine. An adult aged 87 years or older who has certain medical conditions and has not been previously immunized should receive 1 dose of PCV13 vaccine. This PCV13 should be followed with a dose of pneumococcal polysaccharide (PPSV23) vaccine. The PPSV23 vaccine dose should be obtained at least 8 weeks after the dose of PCV13 vaccine. An adult aged 48 years or older who has certain medical conditions and previously received 1 or more doses of PPSV23 vaccine should receive 1 dose of PCV13. The PCV13 vaccine dose should be obtained 1 or more years after the last PPSV23 vaccine dose.  Pneumococcal polysaccharide (PPSV23) vaccine. When PCV13 is also indicated, PCV13 should be obtained first. All adults aged 21 years and older should be immunized. An adult younger than age 56 years who has certain medical conditions should be immunized. Any person who resides in a nursing home or long-term care facility should be immunized. An adult smoker should be immunized. People with an immunocompromised condition and certain other conditions should receive both PCV13 and PPSV23 vaccines. People with human immunodeficiency virus (HIV) infection should be immunized as soon as possible after diagnosis. Immunization during chemotherapy or radiation therapy should be avoided. Routine use of PPSV23 vaccine is not recommended for American Indians, [place]  Natives, or people younger than 65 years unless there are medical conditions that require PPSV23 vaccine. When indicated, people who have unknown immunization and have no record of immunization should receive PPSV23 vaccine. One-time revaccination 5 years after the first dose of PPSV23 is recommended for people aged 62 64 years who have chronic kidney failure, nephrotic syndrome, asplenia, or immunocompromised conditions. People who received 1 2 doses of PPSV23 before age 59 years should receive another dose of PPSV23 vaccine at age 36 years or later if at least 5 years have passed since the previous dose. Doses of PPSV23 are not needed for people immunized with PPSV23 at or after age 73 years.  Meningococcal vaccine. Adults with asplenia or persistent complement component deficiencies should receive 2 doses of quadrivalent meningococcal conjugate (MenACWY-D) vaccine. The doses should be obtained at least 2 months apart. Microbiologists working with certain meningococcal bacteria, [place] recruits, people at risk during an outbreak, and people who travel to or live in countries with a high rate of meningitis should be immunized. A first-year college student up through age 8 years who is living in a residence hall should receive a dose if she did not receive a dose on or after her 16th birthday. Adults who have certain high-risk conditions should receive one or more doses of vaccine.  Hepatitis A vaccine. Adults who wish to be protected from this disease, have certain high-risk conditions, work with hepatitis A-infected  animals, work in hepatitis A research labs, or travel to or work in countries with a high rate of hepatitis A should be immunized. Adults who were previously unvaccinated and who anticipate close contact with an international adoptee during the first 60 days after arrival in the [place] from a country with a high rate of hepatitis A should be immunized.  Hepatitis B vaccine.  Adults who  wish to be protected from this disease, have certain high-risk conditions, may be exposed to blood or other infectious body fluids, are household contacts or sex partners of hepatitis B positive people, are clients or workers in certain care facilities, or travel to or work in countries with a high rate of hepatitis B should be immunized.  Haemophilus influenzae type b (Hib) vaccine. A previously unvaccinated person with asplenia or sickle cell disease or having a scheduled splenectomy should receive 1 dose of Hib vaccine. Regardless of previous immunization, a recipient of a hematopoietic stem cell transplant should receive a 3-dose series 6 12 months after her successful transplant. Hib vaccine is not recommended for adults with HIV infection.  Preventive Services / Frequency Ages 31 to 39years  Blood pressure check.** / Every 1 to 2 years.  Lipid and cholesterol check.** / Every 5 years beginning at age 75.  Clinical breast exam.** / Every 3 years for women in their 46s and 40s.  BRCA-related cancer risk assessment.** / For women who have family members with a BRCA-related cancer (breast, ovarian, tubal, or peritoneal cancers).  Pap test.** / Every 2 years from ages 45 through 56. Every 3 years starting at age 74 through age 80 or 1 with a history of 3 consecutive normal Pap tests.  HPV screening.** / Every 3 years from ages 69 through ages 66 to 69 with a history of 3 consecutive normal Pap tests.  Hepatitis C blood test.** / For any individual with known risks for hepatitis C.  Skin self-exam. / Monthly.  Influenza vaccine. / Every year.  Tetanus, diphtheria, and acellular pertussis (Tdap, Td) vaccine.** / Consult your health care provider. Pregnant women should receive 1 dose of Tdap vaccine during each pregnancy. 1 dose of Td every 10 years.  Varicella vaccine.** / Consult your health care provider. Pregnant females who do not have evidence of immunity should receive the first dose  after pregnancy.  HPV vaccine. / 3 doses over 6 months, if 38 and younger. The vaccine is not recommended for use in pregnant females. However, pregnancy testing is not needed before receiving a dose.  Measles, mumps, rubella (MMR) vaccine.** / You need at least 1 dose of MMR if you were born in 1957 or later. You may also need a 2nd dose. For females of childbearing age, rubella immunity should be determined. If there is no evidence of immunity, females who are not pregnant should be vaccinated. If there is no evidence of immunity, females who are pregnant should delay immunization until after pregnancy.  Pneumococcal 13-valent conjugate (PCV13) vaccine.** / Consult your health care provider.  Pneumococcal polysaccharide (PPSV23) vaccine.** / 1 to 2 doses if you smoke cigarettes or if you have certain conditions.  Meningococcal vaccine.** / 1 dose if you are age 57 to 64 years and a Market researcher living in a residence hall, or have one of several medical conditions, you need to get vaccinated against meningococcal disease. You may also need additional booster doses.  Hepatitis A vaccine.** / Consult your health care provider.  Hepatitis B vaccine.** /  Consult your health care provider.  Haemophilus influenzae type b (Hib) vaccine.** / Consult your health care provider.  Ages 24 to 64years  Blood pressure check.** / Every 1 to 2 years.  Lipid and cholesterol check.** / Every 5 years beginning at age 86 years.  Lung cancer screening. / Every year if you are aged 23 80 years and have a 30-pack-year history of smoking and currently smoke or have quit within the past 15 years. Yearly screening is stopped once you have quit smoking for at least 15 years or develop a health problem that would prevent you from having lung cancer treatment.  Clinical breast exam.** / Every year after age 63 years.  BRCA-related cancer risk assessment.** / For women who have family members with a  BRCA-related cancer (breast, ovarian, tubal, or peritoneal cancers).  Mammogram.** / Every year beginning at age 25 years and continuing for as long as you are in good health. Consult with your health care provider.  Pap test.** / Every 3 years starting at age 61 years through age 56 or 67 years with a history of 3 consecutive normal Pap tests.  HPV screening.** / Every 3 years from ages 22 years through ages 26 to 68 years with a history of 3 consecutive normal Pap tests.  Fecal occult blood test (FOBT) of stool. / Every year beginning at age 36 years and continuing until age 63 years. You may not need to do this test if you get a colonoscopy every 10 years.  Flexible sigmoidoscopy or colonoscopy.** / Every 5 years for a flexible sigmoidoscopy or every 10 years for a colonoscopy beginning at age 35 years and continuing until age 12 years.  Hepatitis C blood test.** / For all people born from [birth year] through 1963 and any individual with known risks for hepatitis C.  Skin self-exam. / Monthly.  Influenza vaccine. / Every year.  Tetanus, diphtheria, and acellular pertussis (Tdap/Td) vaccine.** / Consult your health care provider. Pregnant women should receive 1 dose of Tdap vaccine during each pregnancy. 1 dose of Td every 10 years.  Varicella vaccine.** / Consult your health care provider. Pregnant females who do not have evidence of immunity should receive the first dose after pregnancy.  Zoster vaccine.** / 1 dose for adults aged 44 years or older.  Measles, mumps, rubella (MMR) vaccine.** / You need at least 1 dose of MMR if you were born in 1957 or later. You may also need a 2nd dose. For females of childbearing age, rubella immunity should be determined. If there is no evidence of immunity, females who are not pregnant should be vaccinated. If there is no evidence of immunity, females who are pregnant should delay immunization until after pregnancy.  Pneumococcal 13-valent conjugate  (PCV13) vaccine.** / Consult your health care provider.  Pneumococcal polysaccharide (PPSV23) vaccine.** / 1 to 2 doses if you smoke cigarettes or if you have certain conditions.  Meningococcal vaccine.** / Consult your health care provider.  Hepatitis A vaccine.** / Consult your health care provider.  Hepatitis B vaccine.** / Consult your health care provider.  Haemophilus influenzae type b (Hib) vaccine.** / Consult your health care provider.  Ages 23 years and over  Blood pressure check.** / Every 1 to 2 years.  Lipid and cholesterol check.** / Every 5 years beginning at age 40 years.  Lung cancer screening. / Every year if you are aged 85 80 years and have a 30-pack-year history of smoking and currently smoke or have quit within  the past 15 years. Yearly screening is stopped once you have quit smoking for at least 15 years or develop a health problem that would prevent you from having lung cancer treatment.  Clinical breast exam.** / Every year after age 16 years.  BRCA-related cancer risk assessment.** / For women who have family members with a BRCA-related cancer (breast, ovarian, tubal, or peritoneal cancers).  Mammogram.** / Every year beginning at age 53 years and continuing for as long as you are in good health. Consult with your health care provider.  Pap test.** / Every 3 years starting at age 67 years through age 51 or 39 years with 3 consecutive normal Pap tests. Testing can be stopped between 65 and 70 years with 3 consecutive normal Pap tests and no abnormal Pap or HPV tests in the past 10 years.  HPV screening.** / Every 3 years from ages 34 years through ages 34 or 93 years with a history of 3 consecutive normal Pap tests. Testing can be stopped between 65 and 70 years with 3 consecutive normal Pap tests and no abnormal Pap or HPV tests in the past 10 years.  Fecal occult blood test (FOBT) of stool. / Every year beginning at age 1 years and continuing until age 66  years. You may not need to do this test if you get a colonoscopy every 10 years.  Flexible sigmoidoscopy or colonoscopy.** / Every 5 years for a flexible sigmoidoscopy or every 10 years for a colonoscopy beginning at age 73 years and continuing until age 70 years.  Hepatitis C blood test.** / For all people born from [birth year] through 1963 and any individual with known risks for hepatitis C.  Osteoporosis screening.** / A one-time screening for women ages 42 years and over and women at risk for fractures or osteoporosis.  Skin self-exam. / Monthly.  Influenza vaccine. / Every year.  Tetanus, diphtheria, and acellular pertussis (Tdap/Td) vaccine.** / 1 dose of Td every 10 years.  Varicella vaccine.** / Consult your health care provider.  Zoster vaccine.** / 1 dose for adults aged 22 years or older.  Pneumococcal 13-valent conjugate (PCV13) vaccine.** / Consult your health care provider.  Pneumococcal polysaccharide (PPSV23) vaccine.** / 1 dose for all adults aged 42 years and older.  Meningococcal vaccine.** / Consult your health care provider.  Hepatitis A vaccine.** / Consult your health care provider.  Hepatitis B vaccine.** / Consult your health care provider.  Haemophilus influenzae type b (Hib) vaccine.** / Consult your health care provider. ** Family history and personal history of risk and conditions may change your health care provider's recommendations. Document Released: 11/26/2001 Document Revised: 07/21/2013  [facility] Patient Information 2014 [place], [place].   EXERCISE AND DIET:  We recommended that you start or continue a regular exercise program for good health. Regular exercise means any activity that makes your heart beat faster and makes you sweat.  We recommend exercising at least 30 minutes per day at least 3 days a week, preferably 5.  We also recommend a diet low in fat and sugar / carbohydrates.  Inactivity, poor dietary choices and obesity can cause diabetes,  heart attack, stroke, and kidney damage, among others.     ALCOHOL AND SMOKING:  Women should limit their alcohol intake to no more than 7 drinks/beers/glasses of wine (combined, not each!) per week. Moderation of alcohol intake to this level decreases your risk of breast cancer and liver damage.  ( And of course, no recreational drugs are part of a  healthy lifestyle.)  Also, you should not be smoking at all or even being exposed to second hand smoke. Most people know smoking can cause cancer, and various heart and lung diseases, but did you know it also contributes to weakening of your bones?  Aging of your skin?  Yellowing of your teeth and nails?   CALCIUM AND VITAMIN D:  Adequate intake of calcium and Vitamin D are recommended.  The recommendations for exact amounts of these supplements seem to change often, but generally speaking 600 mg of calcium (either carbonate or citrate) and 800 units of Vitamin D per day seems prudent. Certain women may benefit from higher intake of Vitamin D.  If you are among these women, your doctor will have told you during your visit.     PAP SMEARS:  Pap smears, to check for cervical cancer or precancers,  have traditionally been done yearly, although recent scientific advances have shown that most women can have pap smears less often.  However, every woman still should have a physical exam from her gynecologist or primary care physician every year. It will include a breast check, inspection of the vulva and vagina to check for abnormal growths or skin changes, a visual exam of the cervix, and then an exam to evaluate the size and shape of the uterus and ovaries.  And after 21 years of age, a rectal exam is indicated to check for rectal cancers. We will also provide age appropriate advice regarding health maintenance, like when you should have certain vaccines, screening for sexually transmitted diseases, bone density testing, colonoscopy, mammograms, etc.     MAMMOGRAMS:  All women over 26 years old should have a yearly mammogram. Many facilities now offer a "3D" mammogram, which may cost around $50 extra out of pocket. If possible,  we recommend you accept the option to have the 3D mammogram performed.  It both reduces the number of women who will be called back for extra views which then turn out to be normal, and it is better than the routine mammogram at detecting truly abnormal areas.     COLONOSCOPY:  Colonoscopy to screen for colon cancer is recommended for all women at age 51.  We know, you hate the idea of the prep.  We agree, BUT, having colon cancer and not knowing it is worse!!  Colon cancer so often starts as a polyp that can be seen and removed at colonscopy, which can quite literally save your life!  And if your first colonoscopy is normal and you have no family history of colon cancer, most women don't have to have it again for 10 years.  Once every ten years, you can do something that may end up saving your life, right?  We will be happy to help you get it scheduled when you are ready.  Be sure to check your insurance coverage so you understand how much it will cost.  It may be covered as a preventative service at no cost, but you should check your particular policy.

## 2019-04-22 LAB — CBC WITH DIFFERENTIAL/PLATELET
Basophils Absolute: 0.1 10*3/uL (ref 0.0–0.2)
Basos: 1 %
EOS (ABSOLUTE): 0.4 10*3/uL (ref 0.0–0.4)
Eos: 6 %
Hematocrit: 41.8 % (ref 34.0–46.6)
Hemoglobin: 14 g/dL (ref 11.1–15.9)
Immature Grans (Abs): 0 10*3/uL (ref 0.0–0.1)
Immature Granulocytes: 0 %
Lymphocytes Absolute: 2.3 10*3/uL (ref 0.7–3.1)
Lymphs: 36 %
MCH: 29.2 pg (ref 26.6–33.0)
MCHC: 33.5 g/dL (ref 31.5–35.7)
MCV: 87 fL (ref 79–97)
Monocytes Absolute: 0.5 10*3/uL (ref 0.1–0.9)
Monocytes: 8 %
Neutrophils Absolute: 3.2 10*3/uL (ref 1.4–7.0)
Neutrophils: 49 %
Platelets: 244 10*3/uL (ref 150–450)
RBC: 4.8 x10E6/uL (ref 3.77–5.28)
RDW: 12.9 % (ref 11.7–15.4)
WBC: 6.5 10*3/uL (ref 3.4–10.8)

## 2019-04-22 LAB — COMPREHENSIVE METABOLIC PANEL
ALT: 12 IU/L (ref 0–32)
AST: 19 IU/L (ref 0–40)
Albumin/Globulin Ratio: 1.7 (ref 1.2–2.2)
Albumin: 4.7 g/dL (ref 3.9–5.0)
Alkaline Phosphatase: 73 IU/L (ref 39–117)
BUN/Creatinine Ratio: 8 — ABNORMAL LOW (ref 9–23)
BUN: 7 mg/dL (ref 6–20)
Bilirubin Total: <0.2 mg/dL (ref 0.0–1.2)
CO2: 19 mmol/L — ABNORMAL LOW (ref 20–29)
Calcium: 9.5 mg/dL (ref 8.7–10.2)
Chloride: 105 mmol/L (ref 96–106)
Creatinine, Ser: 0.83 mg/dL (ref 0.57–1.00)
GFR calc Af Amer: 117 mL/min/{1.73_m2} (ref >59)
GFR calc non Af Amer: 101 mL/min/{1.73_m2} (ref >59)
Globulin, Total: 2.7 g/dL (ref 1.5–4.5)
Glucose: 79 mg/dL (ref 65–99)
Potassium: 4.5 mmol/L (ref 3.5–5.2)
Sodium: 143 mmol/L (ref 134–144)
Total Protein: 7.4 g/dL (ref 6.0–8.5)

## 2019-04-22 LAB — HEMOGLOBIN A1C
Est. average glucose Bld gHb Est-mCnc: 108 mg/dL
Hgb A1c MFr Bld: 5.4 % (ref 4.8–5.6)

## 2019-04-22 LAB — T4, FREE: Free T4: 1.11 ng/dL (ref 0.82–1.77)

## 2019-04-22 LAB — TSH: TSH: 1.61 u[IU]/mL (ref 0.450–4.500)

## 2019-04-22 LAB — VITAMIN D 25 HYDROXY (VIT D DEFICIENCY, FRACTURES): Vit D, 25-Hydroxy: 40.7 ng/mL (ref 30.0–100.0)

## 2019-06-24 LAB — HM PAP SMEAR: HM Pap smear: NEGATIVE

## 2019-07-19 ENCOUNTER — Other Ambulatory Visit: Payer: 59

## 2019-07-19 ENCOUNTER — Other Ambulatory Visit: Payer: Self-pay

## 2019-07-19 DIAGNOSIS — E559 Vitamin D deficiency, unspecified: Secondary | ICD-10-CM

## 2019-07-19 NOTE — Addendum Note (Signed)
Addended by: Amado Coe on: 07/19/2019 08:32 AM   Modules accepted: Orders

## 2019-07-20 LAB — VITAMIN D 25 HYDROXY (VIT D DEFICIENCY, FRACTURES): Vit D, 25-Hydroxy: 47.8 ng/mL (ref 30.0–100.0)

## 2019-07-22 ENCOUNTER — Other Ambulatory Visit: Payer: Self-pay

## 2019-07-22 ENCOUNTER — Ambulatory Visit: Payer: 59 | Admitting: Family Medicine

## 2019-07-22 VITALS — BP 130/83 | HR 89 | Wt 102.0 lb

## 2019-07-22 DIAGNOSIS — N946 Dysmenorrhea, unspecified: Secondary | ICD-10-CM

## 2019-07-22 DIAGNOSIS — J3089 Other allergic rhinitis: Secondary | ICD-10-CM | POA: Diagnosis not present

## 2019-07-22 DIAGNOSIS — J45909 Unspecified asthma, uncomplicated: Secondary | ICD-10-CM

## 2019-07-22 DIAGNOSIS — Z3041 Encounter for surveillance of contraceptive pills: Secondary | ICD-10-CM

## 2019-07-22 DIAGNOSIS — R4183 Borderline intellectual functioning: Secondary | ICD-10-CM

## 2019-07-22 DIAGNOSIS — E559 Vitamin D deficiency, unspecified: Secondary | ICD-10-CM | POA: Diagnosis not present

## 2019-07-22 DIAGNOSIS — F4329 Adjustment disorder with other symptoms: Secondary | ICD-10-CM

## 2019-07-22 NOTE — Progress Notes (Signed)
Impression and Recommendations:    1. Reactive airway disease without complication, unspecified asthma severity, unspecified whether persistent   2.  Seasonal and environmental allergies-txed by Dr Orvil Feil   3. Vitamin D deficiency   4. Dysmenorrhea   5. Stress and adjustment reaction- mainly at work   6. Oral contraceptive use- seen by GYN - Derry Lory, NP   7. Borderline intellectual disability     Female Health - Dysmenorrhea - Seen by [place] patient to call OBGYN for follow-up regarding her female health, birth control and menstrual pain.   Stress and Adjustment Reaction - Mainly at Work - To help relieve stress and anxiety, reviewed the "spokes of the wheel" of mood and health management.  Emphasized the importance of ongoing prudent habits, including regular exercise, appropriate sleep hygiene, healthful dietary habits, and prayer/meditation to relax.  - Discussed counseling/therapy and prescription intervention as additional methods of assistance.  Pt declines need for that at this point  - Will continue to monitor.   Seasonal & Environmental Allergies, RAD - treated by Dr. Orvil Feil - Stable on current management. - Continues Pulmicort for allergies. - Continues Xyzal and Flonase. - Patient tolerating meds well without S-E.  - Discussed beginning Singulair in the future PRN.  - Will continue to monitor.   Vitamin D Deficiency - Increased to 47.8 from 40.7 three months ago. - Continue taking 5000 IU's of Vitamin D3 daily.  See med list. - In winter, advised taking 10k IU's two days per week. - Will continue to monitor and re-check as recommended.   Lifestyle & Preventative Health Maintenance - Advised patient to continue working toward exercising to improve overall mental, physical, and emotional health.    - Encouraged patient to engage in daily physical activity, especially a formal exercise routine.  Recommended that the patient eventually strive for  at least 150 minutes of moderate cardiovascular activity per week according to guidelines established by the [facility].   - Healthy dietary habits encouraged, including low-carb, and high amounts of lean protein in diet.   - Patient should also consume adequate amounts of water.  - Health counseling performed.  All questions answered.  Expresses verbal understanding and consents to current therapy and treatment regimen.  No barriers to understanding were identified.  Red flag symptoms and signs discussed in detail.  Patient expressed understanding regarding what to do in case of emergency\urgent symptoms  Please see AVS handed out to patient at the end of our visit for further patient instructions/ counseling done pertaining to today's office visit.   Return for 4 months Vitamin D, mood.     Note:  This note was prepared with assistance of Dragon voice recognition software. Occasional wrong-word or sound-a-like substitutions may have occurred due to the inherent limitations of voice recognition software.   This document serves as a record of services personally performed by Mellody Dance, DO. It was created on her behalf by Toni Amend, a trained medical scribe. The creation of this record is based on the scribe's personal observations and the provider's statements to them.   I have reviewed the above medical documentation for accuracy and completeness and I concur.  Mellody Dance, DO 08/01/2019 4:37 PM  --------------------------------------------------------------------------------------------------------------------------------------------------------------------------------------------------------------------------------------------    Subjective:     HPI: Kathleen Myers is a 23 y.o. female who presents to [place] [facility] today for issues as discussed below.  Notes "it's just been a stressful week and I'm in  pain."  Overall, thinks she's handling  stress of life and COVID "the best I can."  - Lifestyle Thinks she's sleeping pretty good.  Notes the most exercise she gets is her eight hours at work.  She says "my first meal of the day is always lunch by eleven, because whenever I wake up I am never hungry for breakfast."  Still has the same boyfriend; has been with him for a year and a month.  She is sexually active.  Denies smoking.  - Vitamin D Deficiency Continues taking 5000 IU's daily.  - Starting her Period Notes just starting her period and cramping badly today.  Says sometimes her cramps are very painful, "this time [they hurt] more than before, but I don't know why."  She is continuing to take birth control pills but does not remember when she last saw the OBGYN.  - Stress at Work Says she's had a terrible week at work; continues to work at [employer].  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  Notes "I need to call in for my pills."  Confirms she continues Pulmicort for allergies.  Notes she has had asthma in the past.  Has not had to use her rescue inhaler in "quite some time; years, probably."  - Nausea Has not had to use the Zofran for nausea; "haven't felt nauseous in a while."    Wt Readings from Last 3 Encounters:  07/22/19 102 lb (46.3 kg)  04/21/19 106 lb 11.2 oz (48.4 kg)  12/29/18 104 lb 4.8 oz (47.3 kg)   BP Readings from Last 3 Encounters:  07/22/19 130/83  04/21/19 102/72  12/29/18 119/81   Pulse Readings from Last 3 Encounters:  07/22/19 89  04/21/19 75  12/29/18 (!) 119   BMI Readings from Last 3 Encounters:  07/22/19 18.07 kg/m  04/21/19 18.90 kg/m  12/29/18 18.48 kg/m     Patient Care Team    Relationship Specialty Notifications Start End  Thomasene Lotpalski, Carlin Attridge, DO PCP - General  Family Medicine  09/18/16   Eldridge AbrahamsPizoli, Carolyn Elizabeth, MD Referring Physician Pediatric Neurology  09/18/16    Comment: 6 yrs ago  Eileen StanfordWhelan, Meg, MD Referring Physician Allergy and Immunology  09/18/16   Key, Verita SchneidersEvelyn M, NP Nurse Practitioner Gynecology  09/18/16   Verne CarrowYoung, William, MD Consulting Physician Ophthalmology  09/18/16    Comment: still actively seeing him     Patient Active Problem List   Diagnosis Date Noted   Dysmenorrhea in adolescent 10/17/2017    Priority: High   Family history of diabetes mellitus (DM) 10/17/2017    Priority: High   Oral contraceptive use- seen by GYN Darlyn Chamber- Evelyn Key, NP 10/08/2016    Priority: High    Seasonal and environmental allergies-txed by Dr Barnetta ChapelWhelan 09/18/2016    Priority: High   Borderline intellectual disability 12/05/2011    Priority: High   Cortical visual impairment- age 65 09/18/2016    Priority: Medium   Seizure (HCC) 09/18/2016    Priority: Medium   h/o Stroke secondary to vomiting episodes per Neurologist 09/18/2016    Priority: Medium   h/o SAH (subarachnoid hemorrhage) at birth 09/18/2016    Priority: Medium   Poor short term memory 09/18/2016    Priority: Medium   Abnormal brain MRI 12/05/2011    Priority: Low   Dysmenorrhea  07/22/2019   Vitamin D deficiency 04/21/2019   Abnormal laboratory test result- low A1c 04/21/2019   enlrged SCM muscle left side of neck- fullness just lat to L lobe of thyroid 04/21/2019   Left otitis media 12/29/2018   Dyspareunia in female 10/21/2018   Fever 08/10/2018   Headache, unspecified headache type 08/10/2018   Nausea without vomiting 08/10/2018   Abnormal MRI of head 06/02/2018   Underweight due to inadequate caloric intake 04/20/2018   Dizziness 04/20/2018   History of seizure 03/24/2018   Irregular periods 10/17/2017   Prolonged periods 10/17/2017   Reactive airway disease without complication 10/17/2017   Upper respiratory symptom 09/18/2016   Neonatal seizures  12/05/2011   Strabismus 12/05/2011    Past Medical history, Surgical history, Family history, Social history, Allergies and Medications have been entered into the medical record, reviewed and changed as needed.    Current Meds  Medication Sig   budesonide (PULMICORT) 180 MCG/ACT inhaler Inhale 1 puff into the lungs daily.   Cholecalciferol (VITAMIN D3) 125 MCG (5000 UT) TABS 5,000 IU OTC vitamin D3 daily.   desogestrel-ethinyl estradiol (APRI,EMOQUETTE,SOLIA) 0.15-30 MG-MCG tablet Take 1 tablet by mouth daily.   fluticasone (FLONASE) 50 MCG/ACT nasal spray USE 1 SPRAY INTO BOTH NOSTRILS DAILY   levocetirizine (XYZAL) 5 MG tablet TAKE 1 TABLET BY MOUTH EVERY EVENING   ondansetron (ZOFRAN) 4 MG tablet Take 1 tablet (4 mg total) by mouth every 8 (eight) hours as needed for nausea or vomiting.    Allergies:  Allergies  Allergen Reactions   Other Other (See Comments)    anaphalaxis with peanuts   Peanut-Containing Drug Products Anaphylaxis   Celery Oil Other (See Comments)     Review of Systems:  A fourteen system review of systems was performed and found to be positive as per HPI.   Objective:   Blood pressure 130/83, pulse 89, weight 102 lb (46.3 kg), SpO2 100 %. Body mass index is 18.07 kg/m. General:  Well Developed, well nourished, appropriate for stated age.  Neuro:  Alert and oriented,  extra-ocular muscles intact  HEENT:  Normocephalic, atraumatic, neck supple, no carotid bruits appreciated  Skin:  no gross rash, warm, pink. Cardiac:  RRR, S1 S2 Respiratory:  ECTA B/L and A/P, Not using accessory muscles, speaking in full sentences- unlabored. Vascular:  Ext warm, no cyanosis apprec.; cap RF less 2 sec. Psych:  No HI/SI, judgement and insight good, Euthymic mood. Full Affect.

## 2019-07-22 NOTE — Patient Instructions (Signed)
Continue to take 5000 IU's of Vitamin D3 daily, but two days per week in the winter, double the dose.

## 2019-08-13 ENCOUNTER — Telehealth: Payer: Self-pay | Admitting: Family Medicine

## 2019-08-13 NOTE — Telephone Encounter (Signed)
Patient called to see if we do TB test here, was unsure of which test was required (Skin test or Quantiferon Blood test)   --advised pt to  Find out which one required & then call back to make appt.  --fyi to med asst.  --glh

## 2019-08-18 ENCOUNTER — Ambulatory Visit (INDEPENDENT_AMBULATORY_CARE_PROVIDER_SITE_OTHER): Payer: 59

## 2019-08-18 ENCOUNTER — Other Ambulatory Visit: Payer: Self-pay

## 2019-08-18 DIAGNOSIS — Z111 Encounter for screening for respiratory tuberculosis: Secondary | ICD-10-CM | POA: Diagnosis not present

## 2019-08-18 NOTE — Progress Notes (Signed)
HPI:  Patient is here for PPD placement.  A&P:  Patient tolerated injection well without complications.  T. Nelson, CMA  

## 2019-08-20 ENCOUNTER — Other Ambulatory Visit: Payer: Self-pay

## 2019-08-20 ENCOUNTER — Ambulatory Visit (INDEPENDENT_AMBULATORY_CARE_PROVIDER_SITE_OTHER): Payer: 59 | Admitting: Family Medicine

## 2019-08-20 DIAGNOSIS — Z111 Encounter for screening for respiratory tuberculosis: Secondary | ICD-10-CM | POA: Diagnosis not present

## 2019-08-20 LAB — TB SKIN TEST
Induration: 0 mm
TB Skin Test: NEGATIVE

## 2019-08-20 NOTE — Progress Notes (Signed)
HPI:  Patient is here for PPD read.  A&P:  Results were negative, 0 mm induration.  T. Nelson, CMA  

## 2019-09-03 ENCOUNTER — Other Ambulatory Visit: Payer: Self-pay | Admitting: Family Medicine

## 2019-11-22 ENCOUNTER — Ambulatory Visit: Payer: 59 | Admitting: Family Medicine

## 2019-11-23 ENCOUNTER — Ambulatory Visit (INDEPENDENT_AMBULATORY_CARE_PROVIDER_SITE_OTHER): Payer: 59 | Admitting: Family Medicine

## 2019-11-23 ENCOUNTER — Encounter: Payer: Self-pay | Admitting: Family Medicine

## 2019-11-23 ENCOUNTER — Other Ambulatory Visit: Payer: Self-pay

## 2019-11-23 VITALS — Temp 98.0°F | Resp 10 | Ht 62.25 in | Wt 97.3 lb

## 2019-11-23 DIAGNOSIS — R569 Unspecified convulsions: Secondary | ICD-10-CM | POA: Diagnosis not present

## 2019-11-23 DIAGNOSIS — J3089 Other allergic rhinitis: Secondary | ICD-10-CM

## 2019-11-23 DIAGNOSIS — R4183 Borderline intellectual functioning: Secondary | ICD-10-CM

## 2019-11-23 DIAGNOSIS — I6389 Other cerebral infarction: Secondary | ICD-10-CM | POA: Diagnosis not present

## 2019-11-23 DIAGNOSIS — H6692 Otitis media, unspecified, left ear: Secondary | ICD-10-CM

## 2019-11-23 DIAGNOSIS — Z87898 Personal history of other specified conditions: Secondary | ICD-10-CM | POA: Diagnosis not present

## 2019-11-23 DIAGNOSIS — N946 Dysmenorrhea, unspecified: Secondary | ICD-10-CM

## 2019-11-23 DIAGNOSIS — I609 Nontraumatic subarachnoid hemorrhage, unspecified: Secondary | ICD-10-CM

## 2019-11-23 MED ORDER — FLUTICASONE PROPIONATE 50 MCG/ACT NA SUSP: NASAL | 5 refills | Status: AC

## 2019-11-23 MED ORDER — PRENATAL VITAMIN PLUS LOW IRON 27-1 MG PO TABS: 1.0000 | ORAL_TABLET | Freq: Every day | ORAL | 1 refills | Status: DC

## 2019-11-23 MED ORDER — LEVOCETIRIZINE DIHYDROCHLORIDE 5 MG PO TABS: 5.0000 mg | ORAL_TABLET | Freq: Every evening | ORAL | 1 refills | Status: DC

## 2019-11-23 NOTE — Patient Instructions (Addendum)
Please call your neurologist to make sure you follow up with them, and also follow up with cardiology referral that was discussed with you and your mom in the ED.  Begin a prenatal multivitamin daily for the extra iron - you can buy this over the counter and take it daily  Please make sure you eat at least 3 meals/ day with snacks in btwn and also drink at least 1/2 weight in ounces of liquid per day/      Healthy Eating Following a healthy eating pattern may help you to achieve and maintain a healthy body weight, reduce the risk of chronic disease, and live a long and productive life. It is important to follow a healthy eating pattern at an appropriate calorie level for your body. Your nutritional needs should be met primarily through food by choosing a variety of nutrient-rich foods. What are tips for following this plan? Reading food labels  Read labels and choose the following: ? Reduced or low sodium. ? Juices with 100% fruit juice. ? Foods with low saturated fats and high polyunsaturated and monounsaturated fats. ? Foods with whole grains, such as whole wheat, cracked wheat, brown rice, and wild rice. ? Whole grains that are fortified with folic acid. This is recommended for women who are pregnant or who want to become pregnant.  Read labels and avoid the following: ? Foods with a lot of added sugars. These include foods that contain brown sugar, corn sweetener, corn syrup, dextrose, fructose, glucose, high-fructose corn syrup, honey, invert sugar, lactose, malt syrup, maltose, molasses, raw sugar, sucrose, trehalose, or turbinado sugar.  Do not eat more than the following amounts of added sugar per day:  6 teaspoons (25 g) for women.  9 teaspoons (38 g) for men. ? Foods that contain processed or refined starches and grains. ? Refined grain products, such as white flour, degermed cornmeal, white bread, and white rice. Shopping  Choose nutrient-rich snacks, such as vegetables,  whole fruits, and nuts. Avoid high-calorie and high-sugar snacks, such as potato chips, fruit snacks, and candy.  Use oil-based dressings and spreads on foods instead of solid fats such as butter, stick margarine, or cream cheese.  Limit pre-made sauces, mixes, and "instant" products such as flavored rice, instant noodles, and ready-made pasta.  Try more plant-protein sources, such as tofu, tempeh, black beans, edamame, lentils, nuts, and seeds.  Explore eating plans such as the Mediterranean diet or vegetarian diet. Cooking  Use oil to saut or stir-fry foods instead of solid fats such as butter, stick margarine, or lard.  Try baking, boiling, grilling, or broiling instead of frying.  Remove the fatty part of meats before cooking.  Steam vegetables in water or broth. Meal planning   At meals, imagine dividing your plate into fourths: ? One-half of your plate is fruits and vegetables. ? One-fourth of your plate is whole grains. ? One-fourth of your plate is protein, especially lean meats, poultry, eggs, tofu, beans, or nuts.  Include low-fat dairy as part of your daily diet. Lifestyle  Choose healthy options in all settings, including home, work, school, restaurants, or stores.  Prepare your food safely: ? Wash your hands after handling raw meats. ? Keep food preparation surfaces clean by regularly washing with hot, soapy water. ? Keep raw meats separate from ready-to-eat foods, such as fruits and vegetables. ? Cook seafood, meat, poultry, and eggs to the recommended internal temperature. ? Store foods at safe temperatures. In general:  Keep cold foods at 9F (  4.4C) or below.  Keep hot foods at 140F (60C) or above.  Keep your freezer at [facility] (-17.8C) or below.  Foods are no longer safe to eat when they have been between the temperatures of 40-140F (4.4-60C) for more than 2 hours. What foods should I eat? Fruits Aim to eat 2 cup-equivalents of fresh, canned (in  natural juice), or frozen fruits each day. Examples of 1 cup-equivalent of fruit include 1 small apple, 8 large strawberries, 1 cup canned fruit,  cup dried fruit, or 1 cup 100% juice. Vegetables Aim to eat 2-3 cup-equivalents of fresh and frozen vegetables each day, including different varieties and colors. Examples of 1 cup-equivalent of vegetables include 2 medium carrots, 2 cups raw, leafy greens, 1 cup chopped vegetable (raw or cooked), or 1 medium baked potato. Grains Aim to eat 6 ounce-equivalents of whole grains each day. Examples of 1 ounce-equivalent of grains include 1 slice of bread, 1 cup ready-to-eat cereal, 3 cups popcorn, or  cup cooked rice, pasta, or cereal. Meats and other proteins Aim to eat 5-6 ounce-equivalents of protein each day. Examples of 1 ounce-equivalent of protein include 1 egg, 1/2 cup nuts or seeds, or 1 tablespoon (16 g) peanut butter. A cut of meat or fish that is the size of a deck of cards is about 3-4 ounce-equivalents.  Of the protein you eat each week, try to have at least 8 ounces come from seafood. This includes salmon, trout, herring, and anchovies. Dairy Aim to eat 3 cup-equivalents of fat-free or low-fat dairy each day. Examples of 1 cup-equivalent of dairy include 1 cup (240 mL) milk, 8 ounces (250 g) yogurt, 1 ounces (44 g) natural cheese, or 1 cup (240 mL) fortified soy milk. Fats and oils  Aim for about 5 teaspoons (21 g) per day. Choose monounsaturated fats, such as canola and olive oils, avocados, peanut butter, and most nuts, or polyunsaturated fats, such as sunflower, corn, and soybean oils, walnuts, pine nuts, sesame seeds, sunflower seeds, and flaxseed. Beverages  Aim for six 8-oz glasses of water per day. Limit coffee to three to five 8-oz cups per day.  Limit caffeinated beverages that have added calories, such as soda and energy drinks.  Limit alcohol intake to no more than 1 drink a day for nonpregnant women and 2 drinks a day for  men. One drink equals 12 oz of beer (355 mL), 5 oz of wine (148 mL), or 1 oz of hard liquor (44 mL). Seasoning and other foods  Avoid adding excess amounts of salt to your foods. Try flavoring foods with herbs and spices instead of salt.  Avoid adding sugar to foods.  Try using oil-based dressings, sauces, and spreads instead of solid fats. This information is based on general [place] nutrition guidelines. For more information, visit [employer]. Exact amounts may vary based on your nutrition needs. Summary  A healthy eating plan may help you to maintain a healthy weight, reduce the risk of chronic diseases, and stay active throughout your life.  Plan your meals. Make sure you eat the right portions of a variety of nutrient-rich foods.  Try baking, boiling, grilling, or broiling instead of frying.  Choose healthy options in all settings, including home, work, school, restaurants, or stores. This information is not intended to replace advice given to you by your health care provider. Make sure you discuss any questions you have with your health care provider. Document Revised: 01/12/2018 Document Reviewed: 01/12/2018 Elsevier Patient Education  [place].

## 2019-11-23 NOTE — Progress Notes (Signed)
Impression and Recommendations:    1. H/O syncope   2.  Seasonal and environmental allergies-txed by Dr Barnetta Chapel   3. Seizure (HCC)   4. Cerebrovascular accident (CVA) due to other mechanism (HCC)   5. h/o SAH (subarachnoid hemorrhage) at birth   56. Borderline intellectual disability   7. Left otitis media, unspecified otitis media type   8. h/o Dysmenorrhea- now on BCP's thru [facility] f/up - Seen Nov 17 2019 for Near Syncope, h/o Syncope -  Regarding pt's recent hospitalization and/or ED visit: reviewed in great detail recent hospitalization notes, clinical lab tests, tests in the radiology section of CPT, tests in the medicine testing of CPT, and obtained history from family member.  Independent visualization of image, tracing, or specimen itself done by me today at point of care delivery; directly influenced my plan of care for patient. Moderate complexity   - Encouraged patient to follow up with neurology as established, to discuss recent visit to ED. - Patient was referred to [facility] in [place]- told to f/up within 3 days from ED d/c; pt has not done so and did not make a f/up OV. - Encouraged patient to follow up with cardiology as advised in addition to her Neuro Doc - Discussed critical need of eating and drinking regularly during the day. - Advised patient to consume adequate amounts of calories and hydration to improve circulation and provide nutrients to the brain - Encouraged patient to eat hourly if possible, and discussed prudent snacking habits. - advised patient to begin prenatal multivitamin.   Orders Placed This Encounter  Procedures  . Ambulatory referral to Cardiology    Meds ordered this encounter  Medications  . levocetirizine (XYZAL) 5 MG tablet    Sig: Take 1 tablet (5 mg total) by mouth every evening.    Dispense:  90 tablet    Refill:  1  . fluticasone (FLONASE) 50 MCG/ACT nasal spray    Sig: USE 1 SPRAY INTO BOTH  NOSTRILS DAILY    Dispense:  16 g    Refill:  5  . Prenatal Vit-Fe Fumarate-FA (PRENATAL VITAMIN PLUS LOW IRON) 27-1 MG TABS    Sig: Take 1 tablet by mouth daily. Pt told to add MVI with FE to her daily regimen OTC    Dispense:  90 tablet    Refill:  1    Medications Discontinued During This Encounter  Medication Reason  . fluticasone (FLONASE) 50 MCG/ACT nasal spray Reorder  . levocetirizine (XYZAL) 5 MG tablet Reorder     Gross side effects, risk and benefits, and alternatives of medications and treatment plan in general discussed with patient.  Patient is aware that all medications have potential side effects and we are unable to predict every side effect or drug-drug interaction that may occur.   Patient will call with any questions prior to using medication if they have concerns.    Expresses verbal understanding and consents to current therapy and treatment regimen.  No barriers to understanding were identified.  Red flag symptoms and signs discussed in detail.  Patient expressed understanding regarding what to do in case of emergency\urgent symptoms  Please see AVS handed out to patient at the end of our visit for further patient instructions/ counseling done pertaining to today's office visit.   Return for f/up 90mo or sooner if issues/ concerns.     Note:  This note was prepared with assistance of Dragon voice  recognition software. Occasional wrong-word or sound-a-like substitutions may have occurred due to the inherent limitations of voice recognition software.   This document serves as a record of services personally performed by Thomasene Lot, DO. It was created on her behalf by Peggye Fothergill, a trained medical scribe. The creation of this record is based on the scribe's personal observations and the provider's statements to them.   This case required medical decision making of at least moderate complexity.  This case required medical decision making of at least  moderate complexity. The above documentation from Peggye Fothergill, medical scribe, has been reviewed by Carlye Grippe, D.O.    --------------------------------------------------------------------------------------------------------------------------------------------------------------------------------------------------------------------------------------------  Subjective:    Kathleen [facility], am serving as scribe for Dr.Yeily Link.   HPI: Kathleen [facility] is a 24 y.o. female who presents to [facility] today for issues as discussed below.   Notes she is feeling fine today.   Kathleen [facility] Follow-Up; Near Syncope, Seen Nov 17 2019 On 11/17/2019 went to [facility] ER for care.    Woke up that morning, felt completely fine, got ready for work like normal, went to work; "was fine all afternoon at work" until the event occurred.  Notes when she fell unconscious, she wasn't cleaning anything yet.  "By the time that I passed out, I wasn't even cleaning anything."  Says that they wait until every child is gone from work to begin cleaning.  Notes the last kid leaves around 5:30, 5:40, and she was waiting for the final child's grandmother to come.  Just prior to the event, she was sitting on the floor playing with the last remaining toddler, and all of a sudden, she felt really lightheaded.  Notes she tried to get up to get some water, because she thought that might help her feel better, and when she tried to get up, the child's grandmother was knocking on the door.  "Right as I got up, I fell straight on the ground; my head was on the carpet part so I didn't get hurt."  Notes she doesn't remember passing out; "I just remember feeling lightheaded, and then the paramedics got there."  When she woke up, she felt dizzy, "I didn't really know what was going on, I felt disoriented; all I knew was that something happened and now a bunch of people are here helping."  As far as  she knows, she only passed out; no known seizure.  "All I know is that this time I felt dizzy, and I tried to sit down and relax and tried to do what was best to make sure I did not pass out."  Says "one of the teachers that was with me called my mom."  Her mom is familiar with seizures.  Patient notes that her mom spoke with the care team at the [facility] extensively.  She has not followed up with neurology yet.  Notes she was referred to cardiology as well.  Believes the last time she passed out was a year or a year and a half ago.  She isn't sure if this recent event felt similar to the last time she passed out in the past.  She has not had any further episodes since the third of February.  Denies chest pain, SOB, racing heart, or other red flag symptoms during or after the event.  Notes "I can't think of any."   - Eating and Hydration Notes she's been drinking Gatorade and water, mainly.  Says she has  been drinking more water and trying to get more electrolytes in the Gatorade, taking that to work with her every day.  She is also trying to eat more.  So far today she had a cereal bar.  Denies concerns with her weight.  About her physical body, states "I feel fine, I don't see anything really wrong."  Notes "I don't even think about eating half the time; the kids have got me all over the place."  She isn't sure if she passed out due to not eating or drinking.  She takes Vitamin D and allergy medications but does not take a multivitamin.    Wt Readings from Last 3 Encounters:  11/23/19 97 lb 4.8 oz (44.1 kg)  07/22/19 102 lb (46.3 kg)  04/21/19 106 lb 11.2 oz (48.4 kg)   BP Readings from Last 3 Encounters:  07/22/19 130/83  04/21/19 102/72  12/29/18 119/81   Pulse Readings from Last 3 Encounters:  07/22/19 89  04/21/19 75  12/29/18 (!) 119   BMI Readings from Last 3 Encounters:  11/23/19 17.65 kg/m  07/22/19 18.07 kg/m  04/21/19 18.90 kg/m     Patient Care Team     Relationship Specialty Notifications Start End  Mellody Dance, DO PCP - General Family Medicine  09/18/16   Aniceto Boss, MD Referring Physician Pediatric Neurology  09/18/16    Comment: 6 yrs ago  Tiajuana Amass, MD Referring Physician Allergy and Immunology  09/18/16   Key, Nelia Shi, NP Nurse Practitioner Gynecology  09/18/16   Everitt Amber, MD Consulting Physician Ophthalmology  09/18/16    Comment: still actively seeing him     Patient Active Problem List   Diagnosis Date Noted  . Dysmenorrhea in adolescent 10/17/2017  . Family history of diabetes mellitus (DM) 10/17/2017  . Oral contraceptive use- seen by GYN Derry Lory, NP 10/08/2016  .  Seasonal and environmental allergies-txed by Dr Orvil Feil 09/18/2016  . Borderline intellectual disability 12/05/2011  . Cortical visual impairment- age 64 09/18/2016  . Seizure ([place]) 09/18/2016  . h/o Stroke secondary to vomiting episodes per Neurologist 09/18/2016  . h/o SAH (subarachnoid hemorrhage) at birth 09/18/2016  . Poor short term memory 09/18/2016  . Vitamin D deficiency 04/21/2019  . Abnormal brain MRI 12/05/2011  . Dysmenorrhea 07/22/2019  . Abnormal laboratory test result- low A1c 04/21/2019  . enlrged SCM muscle left side of neck- fullness just lat to L lobe of thyroid 04/21/2019  . Left otitis media 12/29/2018  . Dyspareunia in female 10/21/2018  . Fever 08/10/2018  . Headache, unspecified headache type 08/10/2018  . Nausea without vomiting 08/10/2018  . Abnormal MRI of head 06/02/2018  . Underweight due to inadequate caloric intake 04/20/2018  . Dizziness 04/20/2018  . History of seizure 03/24/2018  . Irregular periods 10/17/2017  . Prolonged periods 10/17/2017  . Reactive airway disease without complication 54/06/8118  . Upper respiratory symptom 09/18/2016  . Neonatal seizures 12/05/2011  . Strabismus 12/05/2011    Past Medical history, Surgical history, Family history, Social history, Allergies and  Medications have been entered into the medical record, reviewed and changed as needed.    Current Meds  Medication Sig  . Cholecalciferol (VITAMIN D3) 125 MCG (5000 UT) TABS 5,000 IU OTC vitamin D3 daily.  Marland Kitchen desogestrel-ethinyl estradiol (APRI,EMOQUETTE,SOLIA) 0.15-30 MG-MCG tablet Take 1 tablet by mouth daily.  . fluticasone (FLONASE) 50 MCG/ACT nasal spray USE 1 SPRAY INTO BOTH NOSTRILS DAILY  . levocetirizine (XYZAL) 5 MG tablet Take 1 tablet (5  mg total) by mouth every evening.  . ondansetron (ZOFRAN) 4 MG tablet Take 1 tablet (4 mg total) by mouth every 8 (eight) hours as needed for nausea or vomiting.  Marland Kitchen PULMICORT FLEXHALER 180 MCG/ACT inhaler INHALE 1 PUFF INTO THE LUNGS DAILY  . [DISCONTINUED] fluticasone (FLONASE) 50 MCG/ACT nasal spray USE 1 SPRAY INTO BOTH NOSTRILS DAILY  . [DISCONTINUED] levocetirizine (XYZAL) 5 MG tablet TAKE 1 TABLET BY MOUTH EVERY EVENING    Allergies:  Allergies  Allergen Reactions  . Other Other (See Comments)    anaphalaxis with peanuts  . Peanut-Containing Drug Products Anaphylaxis  . Celery Oil Other (See Comments)     Review of Systems:  A fourteen system review of systems was performed and found to be positive as per HPI.   Objective:   Temperature 98 F (36.7 C), temperature source Oral, resp. rate 10, height 5' 2.25" (1.581 m), weight 97 lb 4.8 oz (44.1 kg), last menstrual period 11/14/2019, SpO2 100 %. Body mass index is 17.65 kg/m. General:  Well Developed, well nourished, appropriate for stated age.  Neuro:  Alert and oriented,  extra-ocular muscles intact  HEENT:  Normocephalic, atraumatic, neck supple, no carotid bruits appreciated  Skin:  no gross rash, warm, pink. Cardiac:  RRR, S1 S2 Respiratory:  ECTA B/L and A/P, Not using accessory muscles, speaking in full sentences- unlabored. Vascular:  Ext warm, no cyanosis apprec.; cap RF less 2 sec. Psych:  No HI/SI, judgement and insight good, Euthymic mood. Full Affect.

## 2019-11-29 ENCOUNTER — Other Ambulatory Visit: Payer: Self-pay

## 2019-11-29 ENCOUNTER — Ambulatory Visit: Payer: 59

## 2019-11-29 ENCOUNTER — Encounter: Payer: Self-pay | Admitting: Cardiology

## 2019-11-29 ENCOUNTER — Ambulatory Visit: Payer: 59 | Admitting: Cardiology

## 2019-11-29 VITALS — BP 119/75 | HR 70 | Temp 97.6°F | Ht 62.0 in | Wt 101.0 lb

## 2019-11-29 DIAGNOSIS — Z8669 Personal history of other diseases of the nervous system and sense organs: Secondary | ICD-10-CM

## 2019-11-29 DIAGNOSIS — R55 Syncope and collapse: Secondary | ICD-10-CM

## 2019-11-29 DIAGNOSIS — F17290 Nicotine dependence, other tobacco product, uncomplicated: Secondary | ICD-10-CM

## 2019-11-29 NOTE — Patient Instructions (Signed)
New Medications that were added at today's visit:  None  Medications that were discontinued at today's visit: None  Office will call you to have the following tests scheduled:  Monitor and Echo  Recommend follow up with your PCP as scheduled.

## 2019-11-29 NOTE — Progress Notes (Signed)
REASON FOR CONSULT: Syncope.   Chief Complaint  Patient presents with  . New Patient (Initial Visit)  . Loss of Consciousness    REQUESTING PHYSICIAN:  Thomasene Lot, DO [address] [place],  [place] [address]  HPI  Kathleen Myers is a 24 y.o. female who presents to the office with a chief complaint of "passing out."  Patient's past medical history and cardiac risk factors include: History of CVA, history of subarachnoid hemorrhage at birth, history of seizures.  Patient is accompanied by her mom at today's office visit.  Patient is a poor historian given her poor short-term memory and therefore a significant part of the HPI was obtained by her mom.  It appears that patient had a near syncopal event on November 18, 2019.  Patient works at a daycare 5 days a week and on November 18, 2019 majority of the kids that she takes care of were picked up by their family members.  She only had 1 toddler that was awaiting a ride.  After playing with the toddler she was starting to pick up the toys and cleaning prior to leaving her work.  Upon rising up and bending forward several times patient noticed lightheaded and dizziness and fell to the floor.  By the time the family came to pick up the toddler they saw the patient on the floor and got help and soon after EMS had arrived.  Patient was taken to [facility] ER for further evaluation.  The episode occurred about 5:30 PM.  It was not witnessed.  But patient remembers that she did not lose consciousness.  She did fall and landing on a carpeted floor.  She did not injure herself.  She did not lose bowel or bladder function.  No seizure-like activities were noted.  In the ER patient underwent imaging, blood work, and received IV fluids.  She was asked to follow-up with cardiology for possible pots syndrome.  Upon further interrogation patient states that she had not eat earlier that day and was not well hydrated.  Patient is below her ideal body weight.   She consumes no more than 2 meals a day.  She consumes 2 cans of soda, coffee, drinks Gatorade couple times a day, alcohol on the weekends, and currently does will vape on a daily basis.  Patient's most recent thyroid function from July 2020 within normal limits.  Patient had an MRI of the brain in 2019 results noted below.   According to the patient's mom she did not have any cardiac history while growing up.  During her school year she did not have any episodes of syncope while playing sports. No family history of sudden cardiac death. No family history of deafness. Last seizure was at the age 32 and according to her mom when she had a EEG done at [facility] they did not notice inducible seizure like activity.  Review of systems positive for: lightheadedness and dizziness at times with change in position quickly. Near syncope in the past. Last seizure at age of 25.  Currently patient denies chest pain, shortness of breath at rest or effort related symptoms, palpitations, orthopnea, paroxysmal nocturnal dyspnea, lower extremity swelling, near syncope, syncopal events, hematochezia, hemoptysis, hematemesis, melanotic stools, no symptoms of amaurosis fugax, motor or sensory symptoms or dysphasia in the last 6 months.   Denies prior history of coronary artery disease, myocardial infarction, congestive heart failure, deep venous thrombosis, pulmonary embolism.  ALLERGIES: Allergies  Allergen Reactions  . Other Other (  See Comments)    anaphalaxis with peanuts  . Peanut-Containing Drug Products Anaphylaxis  . Celery Oil Other (See Comments)     MEDICATION LIST PRIOR TO VISIT: Current Outpatient Medications on File Prior to Visit  Medication Sig Dispense Refill  . Cholecalciferol (VITAMIN D3) 125 MCG (5000 UT) TABS 5,000 IU OTC vitamin D3 daily. 90 tablet 3  . desogestrel-ethinyl estradiol (APRI,EMOQUETTE,SOLIA) 0.15-30 MG-MCG tablet Take 1 tablet by mouth daily.    . fluticasone (FLONASE) 50  MCG/ACT nasal spray USE 1 SPRAY INTO BOTH NOSTRILS DAILY (Patient taking differently: USE 1 SPRAY INTO BOTH NOSTRILS prn) 16 g 5  . levocetirizine (XYZAL) 5 MG tablet Take 1 tablet (5 mg total) by mouth every evening. (Patient taking differently: Take 5 mg by mouth as needed. ) 90 tablet 1  . ondansetron (ZOFRAN) 4 MG tablet Take 1 tablet (4 mg total) by mouth every 8 (eight) hours as needed for nausea or vomiting. 20 tablet 0  . PULMICORT FLEXHALER 180 MCG/ACT inhaler INHALE 1 PUFF INTO THE LUNGS DAILY (Patient taking differently: as needed. ) 1 each 0  . Prenatal Vit-Fe Fumarate-FA (PRENATAL VITAMIN PLUS LOW IRON) 27-1 MG TABS Take 1 tablet by mouth daily. Pt told to add MVI with FE to her daily regimen OTC (Patient not taking: Reported on 11/29/2019) 90 tablet 1   No current facility-administered medications on file prior to visit.    PAST MEDICAL HISTORY: Past Medical History:  Diagnosis Date  . Allergy   . Asthma    as a child  . Cortical visual impairment   . Eczema   . 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   - white matter L side of frontal cortex  . SAH (subarachnoid hemorrhage) ([place])   . Seizures ([place])   . Stroke [facility])    seizure/stroke    PAST SURGICAL HISTORY: The patient  Past Surgical History:  Procedure Laterality Date  . EYE SURGERY    . STRABISMUS SURGERY      FAMILY HISTORY: The patient family history includes Arrhythmia in her mother; Cancer in her maternal grandmother; Depression in her maternal grandmother and paternal grandmother; Diabetes in her maternal grandfather and maternal grandmother; Healthy in her father and mother; Hyperlipidemia in her father, maternal grandfather, maternal grandmother, and paternal grandfather; Hypertension in her maternal grandfather, maternal grandmother, and paternal grandfather; Other in her maternal grandfather.   SOCIAL HISTORY:  The patient  reports  that she has never smoked. She uses smokeless tobacco. She reports current alcohol use. She reports that she does not use drugs.  14 ORGAN REVIEW OF SYSTEMS: CONSTITUTIONAL: No fever or significant weight loss EYES: No recent significant visual change EARS, NOSE, MOUTH, THROAT: No recent significant change in hearing CARDIOVASCULAR: See discussion in subjective/HPI RESPIRATORY: See discussion in subjective/HPI GASTROINTESTINAL: No recent complaints of abdominal pain GENITOURINARY: No recent significant change in genitourinary status MUSCULOSKELETAL: No recent significant change in musculoskeletal status INTEGUMENTARY: No recent rash NEUROLOGIC: No recent significant change in motor function PSYCHIATRIC: No recent significant change in mood ENDOCRINOLOGIC: No recent significant change in endocrine status HEMATOLOGIC/LYMPHATIC: No recent significant unexpected bruising ALLERGIC/IMMUNOLOGIC: No recent unexplained allergic reaction  PHYSICAL EXAM: Blood pressure 119/75, pulse 70, temperature 97.6 F (36.4 C), height 5\' 2"  (1.575 m), weight 101 lb (45.8 kg), last menstrual period 11/14/2019, SpO2 98 %.  Body mass index is 18.47 kg/m.   Orthostatic VS for the past  72 hrs (Last 3 readings):  Orthostatic BP Patient Position BP Location Cuff Size Orthostatic Pulse  11/29/19 1128 - Sitting Left Arm - -  11/29/19 0937 106/76 Standing Right Arm Normal 79  11/29/19 0936 108/73 Sitting Right Arm Normal 76  11/29/19 0926 119/70 Supine Right Arm Normal 70  CONSTITUTIONAL: Well-developed and well-nourished. No acute distress.  SKIN: Skin is warm and dry. No rash noted. No cyanosis. No pallor. No jaundice HEAD: Normocephalic and atraumatic.  EYES: No scleral icterus MOUTH/THROAT: Moist oral membranes.  NECK: No JVD present. No thyromegaly noted. No carotid bruits  LYMPHATIC: No visible cervical adenopathy.  CHEST Normal respiratory effort. No intercostal retractions  LUNGS: Clear to auscultation  bilaterally.  No stridor. No wheezes. No rales.  CARDIOVASCULAR: Regular rate rhythm, positive S1-S2, no murmurs rubs or gallops appreciated. ABDOMINAL: Nonobese, soft, nontender, nondistended, positive bowel sounds in all 4 quadrants.  No apparent ascites.  EXTREMITIES: Bilateral lower extremities cool to touch compared to upper extremities.  No peripheral edema.  2+ bilateral radial, femoral, dorsalis pedis and posterior tibial pulses.  HEMATOLOGIC: No significant bruising NEUROLOGIC: Oriented to person, place, and time. Nonfocal. Normal muscle tone.  PSYCHIATRIC: Normal mood and affect. Normal behavior. Cooperative  RADIOLOGY: 04/08/2018: MRI brain (with and without) demonstrating: - Bilateral frontal, parietal and occipital encephalomalacia and gliosis, possibly related to remote watershed ischemic infarctions or other hypoxic ischemic encephalopathy. - No acute findings.  CARDIAC DATABASE: EKG: 11/29/2019: Normal sinus rhythm with a ventricular rate of 65 bpm, normal axis.    Echocardiogram: None  Stress Testing:  None  LABORATORY DATA: CBC Latest Ref Rng & Units 04/21/2019 04/20/2018 03/24/2018  WBC 3.4 - 10.8 x10E3/uL 6.5 8.1 7.2  Hemoglobin 11.1 - 15.9 g/dL 65.7 84.6 96.2  Hematocrit 34.0 - 46.6 % 41.8 37.0 41.3  Platelets 150 - 450 x10E3/uL 244 262 252     CMP Latest Ref Rng & Units 04/21/2019 04/20/2018 03/24/2018  Glucose 65 - 99 mg/dL 79 82 80  BUN 6 - 20 mg/dL 7 5(L) 7  Creatinine 9.52 - 1.00 mg/dL 8.41 3.24 4.01  Sodium 134 - 144 mmol/L 143 139 140  Potassium 3.5 - 5.2 mmol/L 4.5 3.8 4.7  Chloride 96 - 106 mmol/L 105 104 105  CO2 20 - 29 mmol/L 19(L) 19(L) 20  Calcium 8.7 - 10.2 mg/dL 9.5 9.4 9.5  Total Protein 6.0 - 8.5 g/dL 7.4 7.0 7.1  Total Bilirubin 0.0 - 1.2 mg/dL <0.2 0.3 0.3  Alkaline Phos 39 - 117 IU/L 73 62 63  AST 0 - 40 IU/L 19 18 13   ALT 0 - 32 IU/L 12 12 10      Lipid Panel     Component Value Date/Time   CHOL 158 04/20/2018 0900   TRIG 136  04/20/2018 0900   HDL 54 04/20/2018 0900   CHOLHDL 2.9 04/20/2018 0900   LDLCALC 77 04/20/2018 0900   LABVLDL 27 04/20/2018 0900   FINAL MEDICATION LIST END OF ENCOUNTER: No orders of the defined types were placed in this encounter.   There are no discontinued medications.   IMPRESSION:    ICD-10-CM   1. Near syncope  R55 EKG 12-Lead    PCV ECHOCARDIOGRAM COMPLETE    HOLTER MONITOR - 48 HOUR  2. Vaping nicotine dependence, tobacco product  F17.290   3. Hx of seizure disorder  Z86.69 PCV ECHOCARDIOGRAM COMPLETE    HOLTER MONITOR - 48 HOUR   RECOMMENDATIONS: Near-syncope:  It appears that patient has had few near syncopal  episodes during her lifetime thus far.  The most recent episode was on November 18, 2019.  Both the patient and her mom state that she did not lose consciousness or seizure-like activities.  No history of congenital heart disease or syncopal events while growing up.  No family history of malignant arrhythmias or sudden cardiac death.  No family history of deafness.  Orthostatic vital signs negative.  Most recent lab work reviewed.  TSH within normal limits.  EKG in the office, findings noted above.  Plan echocardiogram to evaluate for structural heart disease.  48-hour Holter monitor to evaluate for underlying arrhythmic burden.  Patient is encouraged to eat a healthy diet 3 times a day, increase fluid intake, may introduce more salt in the diet.  She has been encouraged to decrease her nicotine use as well as alcohol use until the resolution of the symptoms.  Both the patient and her mother are educated on seeking medical attention at the closest ER via EMS if she were to have a true syncopal event.  If patient has any arrhythmic findings on Holter monitor and/or continues to have symptoms may be beneficial to see EP for possible EP tilt test and further evaluation.  Patient is also encouraged to follow-up with neurology as per her PCPs  recommendation given her history.   Orders Placed This Encounter  Procedures  . HOLTER MONITOR - 48 HOUR  . EKG 12-Lead  . PCV ECHOCARDIOGRAM COMPLETE   --Continue cardiac medications as reconciled in final medication list. --Return in about 4 weeks (around 12/27/2019) for Discussion of test results and symptoms. or sooner if needed. --Continue follow-up with your primary care physician regarding the management of your other chronic comorbid conditions.  Patient's questions and concerns were addressed to her and her mom's satisfaction. She and her mom voices understanding of the instructions provided during this encounter.   Total time spent with patient was 45 minutes and greater than 50% of that time was spent in counseling and coordination care with the patient regarding complex decision making and discussion as state above.  This note was created using a voice recognition software as a result there may be grammatical errors inadvertently enclosed that do not reflect the nature of this encounter. Every attempt is made to correct such errors.  Tessa Lerner, DO, [facility] Office: [phone]

## 2019-12-08 ENCOUNTER — Other Ambulatory Visit: Payer: Self-pay

## 2019-12-08 ENCOUNTER — Ambulatory Visit: Payer: 59

## 2019-12-08 DIAGNOSIS — R55 Syncope and collapse: Secondary | ICD-10-CM

## 2019-12-08 DIAGNOSIS — Z8669 Personal history of other diseases of the nervous system and sense organs: Secondary | ICD-10-CM

## 2019-12-13 NOTE — Progress Notes (Signed)
Called pt to inform her about her echo. Pt understood

## 2019-12-29 ENCOUNTER — Other Ambulatory Visit: Payer: Self-pay

## 2019-12-29 ENCOUNTER — Ambulatory Visit: Payer: 59 | Admitting: Cardiology

## 2019-12-29 ENCOUNTER — Encounter: Payer: Self-pay | Admitting: Cardiology

## 2019-12-29 VITALS — BP 123/87 | HR 85 | Temp 98.7°F | Ht 62.0 in | Wt 99.6 lb

## 2019-12-29 DIAGNOSIS — R55 Syncope and collapse: Secondary | ICD-10-CM

## 2019-12-29 DIAGNOSIS — Z8669 Personal history of other diseases of the nervous system and sense organs: Secondary | ICD-10-CM

## 2019-12-29 DIAGNOSIS — Z712 Person consulting for explanation of examination or test findings: Secondary | ICD-10-CM

## 2019-12-29 DIAGNOSIS — F17290 Nicotine dependence, other tobacco product, uncomplicated: Secondary | ICD-10-CM

## 2019-12-29 NOTE — Progress Notes (Signed)
REASON FOR CONSULT: Syncope.   Chief Complaint  Patient presents with  . Near Syncope    Follow up  . Results    REQUESTING PHYSICIAN:  Mellody Dance, DO [address] [place],  [place] [address]  HPI  Kathleen Myers is a 24 y.o. female who presents to the office with a chief complaint of "passing out."  Patient's past medical history and cardiac risk factors include: History of CVA, history of subarachnoid hemorrhage at birth, history of seizures.  Near-syncope: Patient was initially referred to the office for further evaluation of near syncope.  Patient was recommended to have an echocardiogram and a 48-hour Holter monitor.  The echocardiogram shows a preserved left ventricular systolic function additional details noted below.  And 48-hour Holter monitor was unremarkable details noted below for the reference.  Since last office visit patient has not had any near syncopal events.  She is drinking fluids more often and trying to consume 3 meals a day.  She has decreased her alcohol intake as well.  She continues to vape.  She tries to change positions slowly as well.   Patient's most recent thyroid function from July 2020 within normal limits.  Patient had an MRI of the brain in 2019 results noted below.   According to the patient's mom she did not have any cardiac history while growing up.  During her school year she did not have any episodes of syncope while playing sports. No family history of sudden cardiac death. No family history of deafness. Last seizure was at the age 33 and according to her mom when she had a EEG done at [facility] they did not notice inducible seizure like activity.  Currently patient denies chest pain, shortness of breath at rest or effort related symptoms, lightheadedness, dizziness, palpitations, orthopnea, paroxysmal nocturnal dyspnea, lower extremity swelling, near syncope, syncopal events, hematochezia, hemoptysis, hematemesis, melanotic stools, no  symptoms of amaurosis fugax, motor or sensory symptoms or dysphasia in the last 6 months.   Denies prior history of coronary artery disease, myocardial infarction, congestive heart failure, deep venous thrombosis, pulmonary embolism.  ALLERGIES: Allergies  Allergen Reactions  . Other Other (See Comments)    anaphalaxis with peanuts  . Peanut-Containing Drug Products Anaphylaxis  . Celery Oil Other (See Comments)     MEDICATION LIST PRIOR TO VISIT: Current Outpatient Medications on File Prior to Visit  Medication Sig Dispense Refill  . Cholecalciferol (VITAMIN D3) 125 MCG (5000 UT) TABS 5,000 IU OTC vitamin D3 daily. 90 tablet 3  . desogestrel-ethinyl estradiol (APRI,EMOQUETTE,SOLIA) 0.15-30 MG-MCG tablet Take 1 tablet by mouth daily.    . fluticasone (FLONASE) 50 MCG/ACT nasal spray USE 1 SPRAY INTO BOTH NOSTRILS DAILY (Patient taking differently: USE 1 SPRAY INTO BOTH NOSTRILS prn) 16 g 5  . levocetirizine (XYZAL) 5 MG tablet Take 1 tablet (5 mg total) by mouth every evening. (Patient taking differently: Take 5 mg by mouth as needed. ) 90 tablet 1  . ondansetron (ZOFRAN) 4 MG tablet Take 1 tablet (4 mg total) by mouth every 8 (eight) hours as needed for nausea or vomiting. 20 tablet 0  . PULMICORT FLEXHALER 180 MCG/ACT inhaler INHALE 1 PUFF INTO THE LUNGS DAILY (Patient taking differently: as needed. ) 1 each 0  . Prenatal Vit-Fe Fumarate-FA (PRENATAL VITAMIN PLUS LOW IRON) 27-1 MG TABS Take 1 tablet by mouth daily. Pt told to add MVI with FE to her daily regimen OTC (Patient not taking: Reported on 11/29/2019) 90 tablet 1  No current facility-administered medications on file prior to visit.    PAST MEDICAL HISTORY: Past Medical History:  Diagnosis Date  . Allergy   . Asthma    as a child  . Cortical visual impairment   . Eczema   . 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   - white matter L side of frontal  cortex  . SAH (subarachnoid hemorrhage) (HCC)   . Seizures (HCC)   . Stroke [facility])    seizure/stroke    PAST SURGICAL HISTORY: The patient  Past Surgical History:  Procedure Laterality Date  . EYE SURGERY    . STRABISMUS SURGERY      FAMILY HISTORY: The patient family history includes Arrhythmia in her mother; Cancer in her maternal grandmother; Depression in her maternal grandmother and paternal grandmother; Diabetes in her maternal grandfather and maternal grandmother; Healthy in her father and mother; Hyperlipidemia in her father, maternal grandfather, maternal grandmother, and paternal grandfather; Hypertension in her maternal grandfather, maternal grandmother, and paternal grandfather; Other in her maternal grandfather.   SOCIAL HISTORY:  The patient  reports that she has never smoked. She uses smokeless tobacco. She reports current alcohol use. She reports that she does not use drugs.  14 ORGAN REVIEW OF SYSTEMS: CONSTITUTIONAL: No fever or significant weight loss EYES: No recent significant visual change EARS, NOSE, MOUTH, THROAT: No recent significant change in hearing CARDIOVASCULAR: See discussion in subjective/HPI RESPIRATORY: See discussion in subjective/HPI GASTROINTESTINAL: No recent complaints of abdominal pain GENITOURINARY: No recent significant change in genitourinary status MUSCULOSKELETAL: No recent significant change in musculoskeletal status INTEGUMENTARY: No recent rash NEUROLOGIC: No recent significant change in motor function PSYCHIATRIC: No recent significant change in mood ENDOCRINOLOGIC: No recent significant change in endocrine status HEMATOLOGIC/LYMPHATIC: No recent significant unexpected bruising ALLERGIC/IMMUNOLOGIC: No recent unexplained allergic reaction  PHYSICAL EXAM: Blood pressure 123/87, pulse 85, temperature 98.7 F (37.1 C), temperature source Temporal, height 5\' 2"  (1.575 m), weight 99 lb 9.6 oz (45.2 kg).  Body mass index is 18.22  kg/m.   Orthostatic VS for the past 72 hrs (Last 3 readings):  Patient Position BP Location Cuff Size  12/29/19 0859 Sitting Left Arm Normal  CONSTITUTIONAL: Well-developed and well-nourished. No acute distress.  SKIN: Skin is warm and dry. No rash noted. No cyanosis. No pallor. No jaundice HEAD: Normocephalic and atraumatic.  EYES: No scleral icterus MOUTH/THROAT: Moist oral membranes.  NECK: No JVD present. No thyromegaly noted. No carotid bruits  LYMPHATIC: No visible cervical adenopathy.  CHEST Normal respiratory effort. No intercostal retractions  LUNGS: Clear to auscultation bilaterally.  No stridor. No wheezes. No rales.  CARDIOVASCULAR: Regular rate rhythm, positive S1-S2, no murmurs rubs or gallops appreciated. ABDOMINAL: Nonobese, soft, nontender, nondistended, positive bowel sounds in all 4 quadrants.  No apparent ascites.  EXTREMITIES: Bilateral lower extremities cool to touch compared to upper extremities.  No peripheral edema.  2+ bilateral radial, femoral, dorsalis pedis and posterior tibial pulses.  HEMATOLOGIC: No significant bruising NEUROLOGIC: Oriented to person, place, and time. Nonfocal. Normal muscle tone.  PSYCHIATRIC: Normal mood and affect. Normal behavior. Cooperative  RADIOLOGY: 04/08/2018: MRI brain (with and without) demonstrating: - Bilateral frontal, parietal and occipital encephalomalacia and gliosis, possibly related to remote watershed ischemic infarctions or other hypoxic ischemic encephalopathy. - No acute findings.  CARDIAC DATABASE: EKG: 11/29/2019: Normal sinus rhythm with a ventricular rate of 65 bpm, normal axis.    Echocardiogram: 12/08/2019:  Normal LV systolic function with visual EF 60-65%. Left ventricle cavity is normal in size. Normal global wall motion. Normal diastolic filling pattern, normal LAP. No obvious regional wall motion abnormalities.  Calculated EF 65%.  Aneurysmal interatrial septum on 2D Doppler. No intracardiac shunt by  color Doppler. Cannot rule out PFO due to poor subcostal acoustic windows. No significant valvular abnormalities. No prior study for comparison.  Stress Testing:  None  48-hour Holter: From November 29, 2019-December 01, 2019: No atrial fibrillation. No SVTs noted. No AV blocks. No pauses recorded. No nonsustained ventricular episodes. Patient is heart rate ranged between 50 bpm (1:19 AM) and max heart rate of 141 bpm (2:02 PM).  1 patient triggered event noted underlying rhythm to be sinus tachycardia, no symptoms documented.  LABORATORY DATA: CBC Latest Ref Rng & Units 04/21/2019 04/20/2018 03/24/2018  WBC 3.4 - 10.8 x10E3/uL 6.5 8.1 7.2  Hemoglobin 11.1 - 15.9 g/dL 62.8 36.6 29.4  Hematocrit 34.0 - 46.6 % 41.8 37.0 41.3  Platelets 150 - 450 x10E3/uL 244 262 252     CMP Latest Ref Rng & Units 04/21/2019 04/20/2018 03/24/2018  Glucose 65 - 99 mg/dL 79 82 80  BUN 6 - 20 mg/dL 7 5(L) 7  Creatinine 7.65 - 1.00 mg/dL 4.65 0.35 4.65  Sodium 134 - 144 mmol/L 143 139 140  Potassium 3.5 - 5.2 mmol/L 4.5 3.8 4.7  Chloride 96 - 106 mmol/L 105 104 105  CO2 20 - 29 mmol/L 19(L) 19(L) 20  Calcium 8.7 - 10.2 mg/dL 9.5 9.4 9.5  Total Protein 6.0 - 8.5 g/dL 7.4 7.0 7.1  Total Bilirubin 0.0 - 1.2 mg/dL <6.8 0.3 0.3  Alkaline Phos 39 - 117 IU/L 73 62 63  AST 0 - 40 IU/L 19 18 13   ALT 0 - 32 IU/L 12 12 10      Lipid Panel     Component Value Date/Time   CHOL 158 04/20/2018 0900   TRIG 136 04/20/2018 0900   HDL 54 04/20/2018 0900   CHOLHDL 2.9 04/20/2018 0900   LDLCALC 77 04/20/2018 0900   LABVLDL 27 04/20/2018 0900   FINAL MEDICATION LIST END OF ENCOUNTER: No orders of the defined types were placed in this encounter.   There are no discontinued medications.   IMPRESSION:    ICD-10-CM   1. Near syncope  R55   2. Encounter to discuss test results  Z71.2   3. Vaping nicotine dependence, tobacco product  F17.290   4. Hx of seizure disorder  Z86.69    RECOMMENDATIONS: Near-syncope:  Resolved.  No new episodes of near syncope since last office visit.    She is increased her fluid intake, trying to eat 3 meals a day, decreased alcohol consumption.   No history of congenital heart disease or syncopal events while growing up.  No family history of malignant arrhythmias or sudden cardiac death.  No family history of deafness.  TSH within normal limits.  EKG in the office, findings noted above.  Echocardiogram results reviewed with the patient at today's office visit and noted above for further reference.  48-hour Holter monitor results reviewed with the patient and noted above for further reference.    She is encouraged to decrease her nicotine use as well.  Evaluation and management (06/21/2018) with time spent obtaining history, performing exam, reviewing labs, studies, greater than 50% of that time was spent in counseling and coordination care with the patient regarding complex decision making and discussion as state above, and documenting clinical  evaluation.   No orders of the defined types were placed in this encounter.  --Continue cardiac medications as reconciled in final medication list. --Return in about 1 year (around 12/28/2020) for Near Syncope managment. . or sooner if needed. --Continue follow-up with your primary care physician regarding the management of your other chronic comorbid conditions.  Patient's questions and concerns were addressed to her and her mom's satisfaction. She and her mom voices understanding of the instructions provided during this encounter.   Total time spent with patient was 45 minutes and greater than 50% of that time was spent in counseling and coordination care with the patient regarding complex decision making and discussion as state above.  This note was created using a voice recognition software as a result there may be grammatical errors inadvertently enclosed that do not reflect the nature of this encounter. Every attempt  is made to correct such errors.  Tessa Lerner, DO, [facility] [place] Office: [phone]

## 2020-08-17 ENCOUNTER — Telehealth: Payer: Self-pay | Admitting: Physician Assistant

## 2020-08-17 DIAGNOSIS — J3089 Other allergic rhinitis: Secondary | ICD-10-CM

## 2020-08-17 MED ORDER — LEVOCETIRIZINE DIHYDROCHLORIDE 5 MG PO TABS: 5.0000 mg | ORAL_TABLET | Freq: Every evening | ORAL | 0 refills | Status: AC

## 2020-08-17 NOTE — Telephone Encounter (Signed)
Patient needs a refill on xyzal. Please send to [facility] in [place] on [address]. Thanks

## 2020-08-17 NOTE — Addendum Note (Signed)
Addended by: Sylvester Harder on: 08/17/2020 03:46 PM   Modules accepted: Orders

## 2020-08-17 NOTE — Telephone Encounter (Signed)
60 day refill sent to pharmacy.   Please call patient to schedule OV apt for further refills. AS, CMA

## 2020-11-08 ENCOUNTER — Other Ambulatory Visit: Payer: Self-pay

## 2020-11-08 ENCOUNTER — Encounter (HOSPITAL_COMMUNITY): Payer: Self-pay

## 2020-11-08 ENCOUNTER — Ambulatory Visit (HOSPITAL_COMMUNITY)
Admission: EM | Admit: 2020-11-08 | Discharge: 2020-11-08 | Disposition: A | Discharge status: Home / Self Care | Payer: 59 | Attending: Internal Medicine | Admitting: Internal Medicine

## 2020-11-08 DIAGNOSIS — M7660 Achilles tendinitis, unspecified leg: Secondary | ICD-10-CM

## 2020-11-08 MED ORDER — DICLOFENAC SODIUM 1 % EX GEL: 2.0000 g | Freq: Four times a day (QID) | CUTANEOUS | 0 refills | Status: DC

## 2020-11-08 NOTE — Discharge Instructions (Signed)
Make sure to follow up with your PCP next week.

## 2020-11-08 NOTE — ED Provider Notes (Signed)
[facility]    CSN: [id number] Arrival date & time: 11/08/20  1742      History   Chief Complaint Chief Complaint  Patient presents with  . Ankle Pain    X 3 days    HPI Kathleen Myers is a 25 y.o. female who presents with redness and pain on the back on her R heel x 3 days. Pain is worse with walking and barring weight. Denies an injury to this area, or wearing a shoe that could have pressed this area. Also the day before she was not extra active or went hiking. She marked it to see if it would get larger, but has not.     Past Medical History:  Diagnosis Date  . Allergy   . Asthma    as a child  . Cortical visual impairment   . Eczema   . 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   - white matter L side of frontal cortex  . SAH (subarachnoid hemorrhage) (HCC)   . Seizures (HCC)   . Stroke [facility])    seizure/stroke    Patient Active Problem List   Diagnosis Date Noted  . Dysmenorrhea 07/22/2019  . Vitamin D deficiency 04/21/2019  . Abnormal laboratory test result- low A1c 04/21/2019  . enlrged SCM muscle left side of neck- fullness just lat to L lobe of thyroid 04/21/2019  . Left otitis media 12/29/2018  . Dyspareunia in female 10/21/2018  . Fever 08/10/2018  . Headache, unspecified headache type 08/10/2018  . Nausea without vomiting 08/10/2018  . Abnormal MRI of head 06/02/2018  . Underweight due to inadequate caloric intake 04/20/2018  . Dizziness 04/20/2018  . History of seizure 03/24/2018  . Irregular periods 10/17/2017  . Prolonged periods 10/17/2017  . Dysmenorrhea in adolescent 10/17/2017  . Family history of diabetes mellitus (DM) 10/17/2017  . Reactive airway disease without complication 10/17/2017  . Oral contraceptive use- seen by GYN Darlyn Chamber, NP 10/08/2016  . Cortical visual impairment- age 12 09/18/2016  . Seizure (HCC) 09/18/2016  . h/o Stroke secondary to  vomiting episodes per Neurologist 09/18/2016  . h/o SAH (subarachnoid hemorrhage) at birth 09/18/2016  . Poor short term memory 09/18/2016  .  Seasonal and environmental allergies-txed by Dr Barnetta Chapel 09/18/2016  . Upper respiratory symptom 09/18/2016  . Abnormal brain MRI 12/05/2011  . Borderline intellectual disability 12/05/2011  . Neonatal seizures 12/05/2011  . Strabismus 12/05/2011    Past Surgical History:  Procedure Laterality Date  . EYE SURGERY    . STRABISMUS SURGERY      OB History   No obstetric history on file.      Home Medications    Prior to Admission medications   Medication Sig Start Date End Date Taking? Authorizing Provider  Cholecalciferol (VITAMIN D3) 125 MCG (5000 UT) TABS 5,000 IU OTC vitamin D3 daily. 04/21/19  Yes Opalski, Gavin Pound, DO  desogestrel-ethinyl estradiol (APRI,EMOQUETTE,SOLIA) 0.15-30 MG-MCG tablet Take 1 tablet by mouth daily.   Yes [provider]  diclofenac Sodium (VOLTAREN) 1 % GEL Apply 2 g topically 4 (four) times daily. 11/08/20  Yes Rodriguez-Southworth, Nettie Elm, PA-C  fluticasone (FLONASE) 50 MCG/ACT nasal spray USE 1 SPRAY INTO BOTH NOSTRILS DAILY Patient taking differently: USE 1 SPRAY INTO BOTH NOSTRILS prn 11/23/19  Yes Opalski, Deborah, DO  levocetirizine (XYZAL) 5 MG tablet Take 1 tablet (5 mg total) by mouth every evening. **NEEDS APT  FOR FURTHER REFILLS** 08/17/20  Yes Abonza, Maritza, PA-C  PULMICORT FLEXHALER 180 MCG/ACT inhaler INHALE 1 PUFF INTO THE LUNGS DAILY Patient taking differently: as needed. 09/03/19  Yes Opalski, Gavin Pound, DO    Family History Family History  Problem Relation Age of Onset  . Cancer Maternal Grandmother        Uterin, skin  . Depression Maternal Grandmother   . Diabetes Maternal Grandmother   . Hyperlipidemia Maternal Grandmother   . Hypertension Maternal Grandmother   . Diabetes Maternal Grandfather   . Hyperlipidemia Maternal Grandfather   . Hypertension Maternal Grandfather   . Other  Maternal Grandfather        agent orange  . Depression Paternal Grandmother   . Hyperlipidemia Paternal Grandfather   . Hypertension Paternal Grandfather   . Healthy Mother   . Arrhythmia Mother   . Healthy Father   . Hyperlipidemia Father     Social History Social History   Tobacco Use  . Smoking status: Never Smoker  . Smokeless tobacco: Current User  Vaping Use  . Vaping Use: Every day  . Start date: 11/28/2017  . Substances: Nicotine, Flavoring  Substance Use Topics  . Alcohol use: Yes    Comment: socially  . Drug use: No     Allergies   Other, Peanut-containing drug products, and Celery oil   Review of Systems Review of Systems  Musculoskeletal: Positive for gait problem. Negative for joint swelling and myalgias.  Skin: Positive for color change. Negative for pallor, rash and wound.     Physical Exam Triage Vital Signs ED Triage Vitals  Enc Vitals Group     BP 11/08/20 1836 108/73     Pulse Rate 11/08/20 1836 93     Resp 11/08/20 1836 18     Temp 11/08/20 1836 98.2 F (36.8 C)     Temp Source 11/08/20 1836 Oral     SpO2 11/08/20 1836 100 %     Weight --      Height --      Head Circumference --      Peak Flow --      Pain Score 11/08/20 1838 8     Pain Loc --      Pain Edu? --      Excl. in GC? --    No data found.  Updated Vital Signs BP 108/73 (BP Location: Left Arm)   Pulse 93   Temp 98.2 F (36.8 C) (Oral)   Resp 18   LMP 11/08/2020 (Exact Date)   SpO2 100%   Visual Acuity Right Eye Distance:   Left Eye Distance:   Bilateral Distance:    Right Eye Near:   Left Eye Near:    Bilateral Near:     Physical Exam Vitals and nursing note reviewed.  Constitutional:      General: She is not in acute distress.    Appearance: She is not toxic-appearing.  HENT:     Right Ear: External ear normal.     Left Ear: External ear normal.  Eyes:     Conjunctiva/sclera: Conjunctivae normal.  Cardiovascular:     Pulses: Normal pulses.   Pulmonary:     Effort: Pulmonary effort is normal.  Musculoskeletal:        General: Swelling and tenderness present. No deformity or signs of injury. Normal range of motion.     Cervical back: Neck supple.     Right lower leg: No edema.     Left lower leg:  No edema.     Comments: R HEEL- revealed erythema, and mild swelling on the heel bone at the achillis insertion where she is tender on the bone and the tendon. Thompson test is negative. No nodularities palpated on this tendon.   Skin:    General: Skin is warm and dry.     Findings: Erythema present. No bruising or rash.  Neurological:     Mental Status: She is alert and oriented to person, place, and time.     Gait: Gait normal.  Psychiatric:        Mood and Affect: Mood normal.        Behavior: Behavior normal.        Thought Content: Thought content normal.        Judgment: Judgment normal.    UC Treatments / Results  Labs (all labs ordered are listed, but only abnormal results are displayed) Labs Reviewed - No data to display  EKG   Radiology No results found.  Procedures Procedures (including critical care time)  Medications Ordered in UC Medications - No data to display  Initial Impression / Assessment and Plan / UC Course  I have reviewed the triage vital signs and the nursing notes. Has achillis tendonitis of unknown cause. I placed her on Voltaren gel as noted. Needs to FU with PCP. See instructions.    Final Clinical Impressions(s) / UC Diagnoses   Final diagnoses:  Achilles tendon pain     Discharge Instructions     Make sure to follow up with your PCP next week.    ED Prescriptions    Medication Sig Dispense Auth. Provider   diclofenac Sodium (VOLTAREN) 1 % GEL Apply 2 g topically 4 (four) times daily. 100 g Rodriguez-Southworth, Nettie Elm, PA-C     PDMP not reviewed this encounter.   Garey Ham, Cordelia Poche 11/08/20 1902

## 2020-11-08 NOTE — ED Triage Notes (Signed)
Patient states she has had right ankle pain since Monday morning with some redness. Pt states she has trouble walking/weight bearing. Pt states she doesn't know of any injury. Pt is oax4 and ambulatory with a limp.

## 2020-11-28 ENCOUNTER — Ambulatory Visit: Payer: 59 | Admitting: Neurology

## 2020-11-28 ENCOUNTER — Telehealth: Payer: Self-pay | Admitting: Physician Assistant

## 2020-11-28 NOTE — Telephone Encounter (Signed)
Patient notified and left voicemail. 11-28-20

## 2020-11-28 NOTE — Telephone Encounter (Signed)
Patient is starting a new job and needs to take a TB test. She also needs a staff medical report filled out by her primary care provider. Please advise, thanks.

## 2020-11-28 NOTE — Telephone Encounter (Signed)
Please contact patient and schedule CPE. Patient will need apt before tb skin test can be given and before paperwork can be filled out. AS, CMA

## 2020-12-08 ENCOUNTER — Ambulatory Visit (INDEPENDENT_AMBULATORY_CARE_PROVIDER_SITE_OTHER): Payer: 59 | Admitting: Nurse Practitioner

## 2020-12-08 ENCOUNTER — Encounter: Payer: Self-pay | Admitting: Nurse Practitioner

## 2020-12-08 ENCOUNTER — Other Ambulatory Visit: Payer: Self-pay

## 2020-12-08 VITALS — BP 118/77 | HR 95 | Temp 98.7°F | Ht 62.0 in | Wt 93.9 lb

## 2020-12-08 DIAGNOSIS — I6389 Other cerebral infarction: Secondary | ICD-10-CM

## 2020-12-08 DIAGNOSIS — Z111 Encounter for screening for respiratory tuberculosis: Secondary | ICD-10-CM

## 2020-12-08 DIAGNOSIS — Z23 Encounter for immunization: Secondary | ICD-10-CM | POA: Diagnosis not present

## 2020-12-08 DIAGNOSIS — Z7689 Persons encountering health services in other specified circumstances: Secondary | ICD-10-CM

## 2020-12-08 DIAGNOSIS — R569 Unspecified convulsions: Secondary | ICD-10-CM | POA: Diagnosis not present

## 2020-12-08 NOTE — Progress Notes (Signed)
PPD lot # [id number] Exp: 12/28/2021 NDC: 62703-500-93 Placed in L forearm.

## 2020-12-08 NOTE — Patient Instructions (Signed)
Seizure, Adult A seizure is a sudden burst of abnormal electrical and chemical activity in the brain. Seizures usually last from 30 seconds to 2 minutes. The abnormal activity temporarily interrupts normal brain function. Many types of seizures can affect adults. A seizure can cause many different symptoms depending on where in the brain it starts. What are the causes? Common causes of this condition include:  Fever or infection.  Brain injury, head trauma, bleeding in the brain, or a brain tumor.  Low levels of blood sugar or salt (sodium).  Kidney problems or liver problems.  Metabolic disorders or other conditions that are passed from parent to child (are inherited).  Reaction to a substance, such as a drug or a medicine, or suddenly stopping the use of a substance (withdrawal).  A stroke.  Developmental disorders such as autism spectrum disorder or cerebral palsy. In some cases, the cause of a seizure may not be known. Some people who have a seizure never have another one. A person who has repeated seizures over time without a clear cause has a condition called epilepsy. What increases the risk? You are more likely to develop this condition if:  You have a family history of epilepsy.  You have had a tonic-clonic seizure before. This type of seizure causes tightening (contraction) of the muscles of the whole body and loss of consciousness.  You have a history of head trauma, lack of oxygen at birth, or strokes. What are the signs or symptoms? There are many different types of seizures. The symptoms vary depending on the type of seizure you have. Symptoms occur during the seizure. They may also occur before a seizure (aura) and after a seizure (postictal). Symptoms may include the following: Symptoms during a seizure  Uncontrollable shaking (convulsions) with fast, jerky movements of muscles.  Stiffening of the body.  Breathing problems.  Confusion, staring, or  unresponsiveness.  Head nodding, eye blinking or fluttering, or rapid eye movements.  Drooling, grunting, or making clicking sounds with your mouth.  Loss of bladder control and bowel control. Symptoms before a seizure  Fear or anxiety.  Nausea.  Vertigo. This is a feeling like: ? You are moving when you are not. ? Your surroundings are moving when they are not.  Dj vu. This is a feeling of having seen or heard something before.  Odd tastes or smells.  Changes in vision, such as seeing flashing lights or spots. Symptoms after a seizure  Confusion.  Sleepiness.  Headache.  Sore muscles. How is this diagnosed? This condition may be diagnosed based on:  A description of your symptoms. Video of your seizures can be helpful.  Your medical history.  A physical exam. You may also have tests, including:  Blood tests.  CT scan.  MRI.  Electroencephalogram (EEG). This test measures electrical activity in the brain. An EEG can predict whether seizures will return.  A spinal tap, also called a lumbar puncture. This is the removal and testing of fluid that surrounds the brain and spinal cord. How is this treated? Most seizures will stop on their own in less than 5 minutes, and no treatment is needed. Seizures that last longer than 5 minutes will usually need treatment. Seizures may be treated with:  Medicines given through an IV.  Avoiding known triggers, such as medicines that you take for another condition.  Medicines to control seizures or prevent future seizures (antiepileptics), if epilepsy caused your seizures.  Medical devices to prevent and control seizures.  Surgery   to stop seizures or to reduce how often seizures happen, if you have epilepsy that does not respond to medicines.  A diet low in carbohydrates and high in fat (ketogenic diet). Follow these instructions at home: Medicines  Take over-the-counter and prescription medicines only as told by  your health care provider.  Avoid any substances that may prevent your medicine from working properly, such as alcohol. Activity  Follow instructions about activities, such as driving or swimming, that would be dangerous if you had another seizure. Wait until your health care provider says it is safe to do them.  If you live in the [place] check with your local department of motor vehicles [facility]) to find out about local driving laws. Each state has specific rules about when you can legally drive again.  Get enough rest. Lack of sleep can make seizures more likely to occur. Educating others  Teach friends and family what to do if you have a seizure. They should: ? Help you get down to the ground, to prevent a fall. ? Cushion your head and move items away from your body. ? Loosen any tight clothing around your neck. ? Turn you on your side. If you vomit, this helps keep your airway clear. ? Know whether or not you need emergency care. ? Stay with you until you recover.  Also, tell them what not to do if you have a seizure. Tell them: ? They should not hold you down. Holding you down will not stop the seizure. ? They should not put anything in your mouth.   General instructions  Avoid anything that has ever triggered a seizure for you.  Keep a seizure diary. Record what you remember about each seizure, especially anything that might have triggered it.  Keep all follow-up visits. This is important. Contact a health care provider if:  You have another seizure or seizures. Call each time you have a seizure.  Your seizure pattern changes.  You continue to have seizures with treatment.  You have symptoms of an infection or illness. Either of these might increase your risk of having a seizure.  You are unable to take your medicine. Get help right away if:  You have: ? A seizure that does not stop after 5 minutes. ? Several seizures in a row without a complete recovery between  seizures. ? A seizure that makes it harder to breathe. ? A seizure that leaves you unable to speak or use a part of your body.  You do not wake up right away after a seizure.  You injure yourself during a seizure.  You have confusion or pain right after a seizure. These symptoms may represent a serious problem that is an emergency. Do not wait to see if the symptoms will go away. Get medical help right away. Call your local emergency services (911 in the [place] Do not drive yourself to the hospital. Summary  Seizures are caused by abnormal electrical and chemical activity in the brain. The activity disrupts normal brain function and can cause various symptoms.  Seizures have many causes, including illness, head injuries, low levels of blood sugar or salt, and certain conditions.  Most seizures will stop on their own in less than 5 minutes. Seizures that last longer than 5 minutes are a medical emergency and need treatment right away.  Many medicines are used to treat seizures. Take over-the-counter and prescription medicines only as told by your health care provider. This information is not intended to replace advice  given to you by your health care provider. Make sure you discuss any questions you have with your health care provider. Document Revised: 04/07/2020 Document Reviewed: 04/07/2020 Elsevier Patient Education  2021 Elsevier Inc.  

## 2020-12-10 DIAGNOSIS — Z7689 Persons encountering health services in other specified circumstances: Secondary | ICD-10-CM | POA: Insufficient documentation

## 2020-12-10 DIAGNOSIS — Z23 Encounter for immunization: Secondary | ICD-10-CM | POA: Insufficient documentation

## 2020-12-10 DIAGNOSIS — Z111 Encounter for screening for respiratory tuberculosis: Secondary | ICD-10-CM | POA: Insufficient documentation

## 2020-12-10 NOTE — Progress Notes (Signed)
New Patient Office Visit  Subjective:  Patient ID: Kathleen Myers, female    DOB: 06/15/1996  Age: 25 y.o. MRN: [id number]  CC:  Chief Complaint  Patient presents with  . New Patient (Initial Visit)    HPI [name] presents for establishment of primary care provider. The patient is getting fready to start a new job on 12/11/2020. She will be working at [employer]. She needs to have TB skin test and DTAP before she starts her new job. She recently single seizure back in January 2022. She had never had seizure in the past. It is unclear if she has developed a seizure disorder. She is currently taking Keppra twice daily. She is followed per neurology. She does have history of "stroke" at birth. She does have GYN provider who does her well woman exams and pap smears. Last pap smear was 2020.  She did have both of her Pfizer COVID 19 vaccines. Her vaccines were in 06/2020 and 07/2020.  She did have COVID 19 in January 2022, around the time she had her seizure.   Past Medical History:  Diagnosis Date  . Allergy   . Asthma    as a child  . Cortical visual impairment   . Eczema   . 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   - white matter L side of frontal cortex  . SAH (subarachnoid hemorrhage) (HCC)   . Seizures (HCC)   . Stroke [facility])    seizure/stroke    Past Surgical History:  Procedure Laterality Date  . EYE SURGERY    . STRABISMUS SURGERY      Family History  Problem Relation Age of Onset  . Cancer Maternal Grandmother        Uterin, skin  . Depression Maternal Grandmother   . Diabetes Maternal Grandmother   . Hyperlipidemia Maternal Grandmother   . Hypertension Maternal Grandmother   . Diabetes Maternal Grandfather   . Hyperlipidemia Maternal Grandfather   . Hypertension Maternal Grandfather   . Other Maternal Grandfather        agent orange  . Depression Paternal Grandmother   .  Hyperlipidemia Paternal Grandfather   . Hypertension Paternal Grandfather   . Healthy Mother   . Arrhythmia Mother   . Healthy Father   . Hyperlipidemia Father     Social History   Socioeconomic History  . Marital status: Single    Spouse name: Not on file  . Number of children: 0  . Years of education: currently in college  . Highest education level: Not on file  Occupational History  . 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 Smoking status: Never Smoker  . Smokeless tobacco: Current User  Vaping Use  . Vaping Use: Every day  . Start date: 11/28/2017  . Substances: Nicotine, Flavoring  Substance and Sexual Activity  . Alcohol use: Yes    Comment: socially  . Drug use: No  . Sexual activity: Not Currently    Birth control/protection: Pill  Other Topics Concern  . Not on file  Social History Narrative   Lives at home with parents and sister.   Left-handed.   2-3 cups caffeine per day.   Social Determinants of Health   Financial Resource Strain: Not on file  Food Insecurity: Not on file  Transportation Needs: Not on file  Physical Activity: Not on file  Stress: Not on file  Social Connections: Not on file  Intimate Partner Violence: Not on file    ROS Review of Systems  Constitutional: Positive for appetite change. Negative for fatigue.  Neurological: Positive for seizures.    Objective:   Today's Vitals   12/08/20 0916  BP: 118/77  Pulse: 95  Temp: 98.7 F (37.1 C)  SpO2: 97%  Weight: 93 lb 14.4 oz (42.6 kg)  Height: 5\' 2"  (1.575 m)   Body mass index is 17.17 kg/m.   Physical Exam Vitals and nursing note reviewed.  Constitutional:      General: She is not in acute distress.    Appearance: Normal appearance. She is well-developed. She is not diaphoretic.  HENT:     Head: Normocephalic and atraumatic.     Nose: Nose normal.     Mouth/Throat:     Pharynx: No oropharyngeal exudate.  Eyes:     General:        Left eye: Left  eye discharge: establish.    Pupils: Pupils are equal, round, and reactive to light.  Neck:     Thyroid: No thyromegaly.     Vascular: No JVD.     Trachea: No tracheal deviation.  Cardiovascular:     Rate and Rhythm: Normal rate and regular rhythm.     Heart sounds: Normal heart sounds. No murmur heard. No friction rub. No gallop.   Pulmonary:     Effort: Pulmonary effort is normal. No respiratory distress.     Breath sounds: Normal breath sounds. No wheezing or rales.  Chest:     Chest wall: No tenderness.  Abdominal:     General: Bowel sounds are normal.     Palpations: Abdomen is soft.     Tenderness: There is no abdominal tenderness.  Musculoskeletal:        General: Normal range of motion.     Cervical back: Normal range of motion and neck supple.  Lymphadenopathy:     Cervical: No cervical adenopathy.  Skin:    General: Skin is warm and dry.  Neurological:     Mental Status: She is alert and oriented to person, place, and time. Mental status is at baseline.     Cranial Nerves: No cranial nerve deficit.  Psychiatric:        Mood and Affect: Mood normal.        Behavior: Behavior normal.        Thought Content: Thought content normal.        Judgment: Judgment normal.     Assessment & Plan:  1. Encounter to establish care Patient here to establish primary care.   2. Seizure [facility]) Patient taking Keppra and is followed by neurology.   3. Cerebrovascular accident (CVA) due to other mechanism [facility]) Patient followed per neurology  4. Need for Tdap vaccination Tdap vaccine administered today.  - Tdap vaccine greater than or equal to 7yo IM  5. Screening for tuberculosis TB skin test administered in he office today. Patient to follow up Monday morning to read results  - TB Skin Test   Problem List Items Addressed This Visit      Cardiovascular and Mediastinum   Cerebrovascular accident [facility])     Other   Seizure (HCC)   Relevant Medications   levETIRAcetam  (KEPPRA) 500 MG tablet   Encounter to establish care - Primary   Need for Tdap vaccination   Relevant Orders   Tdap vaccine greater than or equal to 7yo IM (Completed)   Screening  for tuberculosis   Relevant Orders   TB Skin Test (Completed)      Outpatient Encounter Medications as of 12/08/2020  Medication Sig  . Cholecalciferol (VITAMIN D3) 125 MCG (5000 UT) TABS 5,000 IU OTC vitamin D3 daily.  Marland Kitchen desogestrel-ethinyl estradiol (APRI,EMOQUETTE,SOLIA) 0.15-30 MG-MCG tablet Take 1 tablet by mouth daily.  . fluticasone (FLONASE) 50 MCG/ACT nasal spray USE 1 SPRAY INTO BOTH NOSTRILS DAILY (Patient taking differently: USE 1 SPRAY INTO BOTH NOSTRILS prn)  . levETIRAcetam (KEPPRA) 500 MG tablet Take 500 mg by mouth 2 (two) times daily.  Marland Kitchen levocetirizine (XYZAL) 5 MG tablet Take 1 tablet (5 mg total) by mouth every evening. **NEEDS APT FOR FURTHER REFILLS**  . PULMICORT FLEXHALER 180 MCG/ACT inhaler INHALE 1 PUFF INTO THE LUNGS DAILY (Patient taking differently: as needed.)  . diclofenac Sodium (VOLTAREN) 1 % GEL Apply 2 g topically 4 (four) times daily. (Patient not taking: Reported on 12/08/2020)   No facility-administered encounter medications on file as of 12/08/2020.   Time spent with the patient was approximately 45 minutes. This time included reviewing progress notes, labs, imaging studies, and discussing plan for follow up.   Follow-up: Return in about 3 days (around 12/11/2020) for PPD skin reading (before 10am).   Carlean Jews, NP

## 2020-12-11 ENCOUNTER — Other Ambulatory Visit: Payer: Self-pay

## 2020-12-11 ENCOUNTER — Other Ambulatory Visit: Payer: 59

## 2020-12-11 LAB — TB SKIN TEST
Induration: 0 mm
TB Skin Test: NEGATIVE

## 2020-12-28 ENCOUNTER — Ambulatory Visit: Payer: 59 | Admitting: Cardiology

## 2021-04-19 DIAGNOSIS — G43009 Migraine without aura, not intractable, without status migrainosus: Secondary | ICD-10-CM | POA: Insufficient documentation

## 2021-12-16 ENCOUNTER — Encounter: Payer: Self-pay | Admitting: Nurse Practitioner

## 2021-12-17 ENCOUNTER — Ambulatory Visit: Payer: 59

## 2022-02-12 ENCOUNTER — Ambulatory Visit
Admission: EM | Admit: 2022-02-12 | Discharge: 2022-02-12 | Disposition: A | Discharge status: Home / Self Care | Payer: 59 | Attending: Internal Medicine | Admitting: Internal Medicine

## 2022-02-12 ENCOUNTER — Encounter: Payer: Self-pay | Admitting: Emergency Medicine

## 2022-02-12 DIAGNOSIS — J020 Streptococcal pharyngitis: Secondary | ICD-10-CM | POA: Diagnosis not present

## 2022-02-12 LAB — POCT RAPID STREP A (OFFICE): Rapid Strep A Screen: POSITIVE — AB

## 2022-02-12 MED ORDER — AMOXICILLIN 500 MG PO CAPS: 500.0000 mg | ORAL_CAPSULE | Freq: Two times a day (BID) | ORAL | 0 refills | Status: AC

## 2022-02-12 NOTE — ED Provider Notes (Signed)
?[facility] ? ? ? ?CSN: [id number] ?Arrival date & time: 02/12/22  1025 ? ? ?  ? ?History   ?Chief Complaint ?Chief Complaint  ?Patient presents with  ? Sore Throat  ? ? ?HPI ?Kathleen Myers is a 26 y.o. female.  ? ?Patient presents with sore throat that started last night.  Denies any other associated symptoms including nasal congestion, runny nose, cough, fever.  Denies any known sick contacts.  Patient has not taken any medications to alleviate symptoms.  Denies chest pain, shortness of breath, nausea, vomiting, diarrhea, abdominal pain. ? ? ?Sore Throat ? ? ?Past Medical History:  ?Diagnosis Date  ? Allergy   ? Asthma   ? as a child  ? Cortical visual impairment   ? Eczema   ? Poor short term memory 09/18/2016  ? Due to stroke/ sev spisodes of SZ- now has some diff memory, learning diff.   - white matter L side of frontal cortex  ? SAH (subarachnoid hemorrhage) (HCC)   ? Seizures (HCC)   ? Stroke [facility])   ? seizure/stroke  ? ? ?Patient Active Problem List  ? Diagnosis Date Noted  ? Encounter to establish care 12/10/2020  ? Need for Tdap vaccination 12/10/2020  ? Screening for tuberculosis 12/10/2020  ? Dysmenorrhea 07/22/2019  ? Vitamin D deficiency 04/21/2019  ? Abnormal laboratory test result- low A1c 04/21/2019  ? enlrged SCM muscle left side of neck- fullness just lat to L lobe of thyroid 04/21/2019  ? Left otitis media 12/29/2018  ? Dyspareunia in female 10/21/2018  ? Fever 08/10/2018  ? Headache, unspecified headache type 08/10/2018  ? Nausea without vomiting 08/10/2018  ? Abnormal MRI of head 06/02/2018  ? Underweight due to inadequate caloric intake 04/20/2018  ? Dizziness 04/20/2018  ? History of seizure 03/24/2018  ? Irregular periods 10/17/2017  ? Prolonged periods 10/17/2017  ? Dysmenorrhea in adolescent 10/17/2017  ? Family history of diabetes mellitus (DM) 10/17/2017  ? Reactive airway disease without complication 10/17/2017  ? Oral contraceptive use- seen by GYN Darlyn Chamber, NP 10/08/2016   ? Cortical visual impairment- age 48 09/18/2016  ? Seizure (HCC) 09/18/2016  ? Cerebrovascular accident [facility]) 09/18/2016  ? h/o SAH (subarachnoid hemorrhage) at birth 09/18/2016  ? Poor short term memory 09/18/2016  ?  Seasonal and environmental allergies-txed by Dr Barnetta Chapel 09/18/2016  ? Upper respiratory symptom 09/18/2016  ? Abnormal brain MRI 12/05/2011  ? Borderline intellectual disability 12/05/2011  ? Neonatal seizures 12/05/2011  ? Strabismus 12/05/2011  ? ? ?Past Surgical History:  ?Procedure Laterality Date  ? EYE SURGERY    ? STRABISMUS SURGERY    ? ? ?OB History   ?No obstetric history on file. ?  ? ? ? ?Home Medications   ? ?Prior to Admission medications   ?Medication Sig Start Date End Date Taking? Authorizing Provider  ?amoxicillin (AMOXIL) 500 MG capsule Take 1 capsule (500 mg total) by mouth 2 (two) times daily for 10 days. 02/12/22 02/22/22 Yes Gustavus Bryant, FNP  ?desogestrel-ethinyl estradiol (APRI,EMOQUETTE,SOLIA) 0.15-30 MG-MCG tablet Take 1 tablet by mouth daily.   Yes [provider]  ?fluticasone (FLONASE) 50 MCG/ACT nasal spray USE 1 SPRAY INTO BOTH NOSTRILS DAILY ?Patient taking differently: USE 1 SPRAY INTO BOTH NOSTRILS prn 11/23/19  Yes Opalski, Deborah, DO  ?levETIRAcetam (KEPPRA) 500 MG tablet Take 500 mg by mouth 2 (two) times daily. 11/16/20  Yes [provider]  ?levocetirizine (XYZAL) 5 MG tablet Take 1 tablet (5 mg total) by  mouth every evening. **NEEDS APT FOR FURTHER REFILLS** 08/17/20  Yes Mayer Masker, PA-C  ?PULMICORT FLEXHALER 180 MCG/ACT inhaler INHALE 1 PUFF INTO THE LUNGS DAILY ?Patient taking differently: as needed. 09/03/19  Yes Thomasene Lot, DO  ?Cholecalciferol (VITAMIN D3) 125 MCG (5000 UT) TABS 5,000 IU OTC vitamin D3 daily. 04/21/19   Thomasene Lot, DO  ? ? ?Family History ?Family History  ?Problem Relation Age of Onset  ? Cancer Maternal Grandmother   ?     Uterin, skin  ? Depression Maternal Grandmother   ? Diabetes Maternal Grandmother   ?  Hyperlipidemia Maternal Grandmother   ? Hypertension Maternal Grandmother   ? Diabetes Maternal Grandfather   ? Hyperlipidemia Maternal Grandfather   ? Hypertension Maternal Grandfather   ? Other Maternal Grandfather   ?     agent orange  ? Depression Paternal Grandmother   ? Hyperlipidemia Paternal Grandfather   ? Hypertension Paternal Grandfather   ? Healthy Mother   ? Arrhythmia Mother   ? Healthy Father   ? Hyperlipidemia Father   ? ? ?Social History ?Social History  ? ?Tobacco Use  ? Smoking status: Never  ? Smokeless tobacco: Current  ?Vaping Use  ? Vaping Use: Every day  ? Start date: 11/28/2017  ? Substances: Nicotine, Flavoring  ?Substance Use Topics  ? Alcohol use: Yes  ?  Comment: socially  ? Drug use: No  ? ? ? ?Allergies   ?Bevelyn Buckles, Other, Peanut-containing drug products, and Celery oil ? ? ?Review of Systems ?Review of Systems ?Per HPI ? ?Physical Exam ?Triage Vital Signs ?ED Triage Vitals  ?Enc Vitals Group  ?   BP 02/12/22 1108 115/81  ?   Pulse Rate 02/12/22 1108 (!) 105  ?   Resp 02/12/22 1108 18  ?   Temp 02/12/22 1108 98.1 ?F (36.7 ?C)  ?   Temp Source 02/12/22 1108 Oral  ?   SpO2 02/12/22 1108 97 %  ?   Weight 02/12/22 1109 100 lb (45.4 kg)  ?   Height 02/12/22 1109 5\' 2"  (1.575 m)  ?   Head Circumference --   ?   Peak Flow --   ?   Pain Score 02/12/22 1109 4  ?   Pain Loc --   ?   Pain Edu? --   ?   Excl. in GC? --   ? ?No data found. ? ?Updated Vital Signs ?BP 115/81 (BP Location: Left Arm)   Pulse (!) 105   Temp 98.1 ?F (36.7 ?C) (Oral)   Resp 18   Ht 5\' 2"  (1.575 m)   Wt 100 lb (45.4 kg)   LMP 12/31/2021 (Exact Date)   SpO2 97%   BMI 18.29 kg/m?  ? ?Visual Acuity ?Right Eye Distance:   ?Left Eye Distance:   ?Bilateral Distance:   ? ?Right Eye Near:   ?Left Eye Near:    ?Bilateral Near:    ? ?Physical Exam ?Constitutional:   ?   General: She is not in acute distress. ?   Appearance: Normal appearance. She is not toxic-appearing or diaphoretic.  ?HENT:  ?   Head: Normocephalic  and atraumatic.  ?   Right Ear: Tympanic membrane and ear canal normal.  ?   Left Ear: Tympanic membrane and ear canal normal.  ?   Nose: Nose normal.  ?   Mouth/Throat:  ?   Mouth: Mucous membranes are moist.  ?   Pharynx: Posterior oropharyngeal erythema present. No oropharyngeal exudate or uvula  swelling.  ?   Tonsils: No tonsillar exudate or tonsillar abscesses.  ?Eyes:  ?   Extraocular Movements: Extraocular movements intact.  ?   Conjunctiva/sclera: Conjunctivae normal.  ?Cardiovascular:  ?   Rate and Rhythm: Normal rate and regular rhythm.  ?   Pulses: Normal pulses.  ?   Heart sounds: Normal heart sounds.  ?Pulmonary:  ?   Effort: Pulmonary effort is normal. No respiratory distress.  ?   Breath sounds: Normal breath sounds.  ?Neurological:  ?   General: No focal deficit present.  ?   Mental Status: She is alert and oriented to person, place, and time. Mental status is at baseline.  ?Psychiatric:     ?   Mood and Affect: Mood normal.     ?   Behavior: Behavior normal.     ?   Thought Content: Thought content normal.     ?   Judgment: Judgment normal.  ? ? ? ?UC Treatments / Results  ?Labs ?(all labs ordered are listed, but only abnormal results are displayed) ?Labs Reviewed  ?POCT RAPID STREP A (OFFICE) - Abnormal; Notable for the following components:  ?    Result Value  ? Rapid Strep A Screen Positive (*)   ? All other components within normal limits  ? ? ?EKG ? ? ?Radiology ?No results found. ? ?Procedures ?Procedures (including critical care time) ? ?Medications Ordered in UC ?Medications - No data to display ? ?Initial Impression / Assessment and Plan / UC Course  ?I have reviewed the triage vital signs and the nursing notes. ? ?Pertinent labs & imaging results that were available during my care of the patient were reviewed by me and considered in my medical decision making (see chart for details). ? ?  ? ?Rapid strep was positive.  Will treat with amoxicillin antibiotic.  No signs of peritonsillar  abscess on exam.  Discussed supportive care with patient.  Discussed return precautions.  Patient verbalized understanding and was agreeable with plan. ?Final Clinical Impressions(s) / UC Diagnoses  ? ?Final diagno

## 2022-02-12 NOTE — Discharge Instructions (Signed)
You have strep throat which is being treated with an antibiotic.  Please follow-up if symptoms persist or worsen. ?

## 2022-02-12 NOTE — ED Triage Notes (Signed)
Patient c/o sore throat since last night, this morning throat very itchy, very painful and swollen.  Patient denies any OTC pain meds. ?

## 2022-04-08 ENCOUNTER — Ambulatory Visit
Admission: EM | Admit: 2022-04-08 | Discharge: 2022-04-08 | Disposition: A | Discharge status: Home / Self Care | Payer: 59 | Attending: Internal Medicine | Admitting: Internal Medicine

## 2022-04-08 DIAGNOSIS — J069 Acute upper respiratory infection, unspecified: Secondary | ICD-10-CM

## 2022-04-08 MED ORDER — FLUTICASONE PROPIONATE 50 MCG/ACT NA SUSP: 1.0000 | Freq: Every day | NASAL | 0 refills | Status: DC

## 2022-04-08 MED ORDER — AMOXICILLIN-POT CLAVULANATE 875-125 MG PO TABS: 1.0000 | ORAL_TABLET | Freq: Two times a day (BID) | ORAL | 0 refills | Status: DC

## 2022-04-08 NOTE — ED Triage Notes (Signed)
Patient presents to Urgent Care with complaints of sinus pressure and chest congestion since last Tuesday. Patient reports taking allergy medications for symptoms.

## 2022-04-08 NOTE — ED Provider Notes (Signed)
[facility]    CSN: [id number] Arrival date & time: 04/08/22  1912      History   Chief Complaint Chief Complaint  Patient presents with   Sinus Problem    HPI Kathleen Myers is a 26 y.o. female.   Patient presents with nasal congestion and significant sinus pressure that started about 6 days ago.  Patient denies any known sick contacts or fever.  Denies cough, chest pain, shortness of breath, sore throat, ear pain, nausea, vomiting, diarrhea, abdominal pain.  Patient has taken over-the-counter antihistamine with minimal improvement.   Sinus Problem    Past Medical History:  Diagnosis Date   Allergy    Asthma    as a child   Cortical visual impairment    Eczema    Poor short term memory 09/18/2016   Due to stroke/ sev spisodes of SZ- now has some diff memory, learning diff.   - white matter L side of frontal cortex   SAH (subarachnoid hemorrhage) (HCC)    Seizures (HCC)    Stroke [facility])    seizure/stroke    Patient Active Problem List   Diagnosis Date Noted   Encounter to establish care 12/10/2020   Need for Tdap vaccination 12/10/2020   Screening for tuberculosis 12/10/2020   Dysmenorrhea 07/22/2019   Vitamin D deficiency 04/21/2019   Abnormal laboratory test result- low A1c 04/21/2019   enlrged SCM muscle left side of neck- fullness just lat to L lobe of thyroid 04/21/2019   Left otitis media 12/29/2018   Dyspareunia in female 10/21/2018   Fever 08/10/2018   Headache, unspecified headache type 08/10/2018   Nausea without vomiting 08/10/2018   Abnormal MRI of head 06/02/2018   Underweight due to inadequate caloric intake 04/20/2018   Dizziness 04/20/2018   History of seizure 03/24/2018   Irregular periods 10/17/2017   Prolonged periods 10/17/2017   Dysmenorrhea in adolescent 10/17/2017   Family history of diabetes mellitus (DM) 10/17/2017   Reactive airway disease without complication 10/17/2017   Oral contraceptive use- seen by GYN Darlyn Chamber, NP 10/08/2016   Cortical visual impairment- age 45 09/18/2016   Seizure (HCC) 09/18/2016   Cerebrovascular accident (HCC) 09/18/2016   h/o SAH (subarachnoid hemorrhage) at birth 09/18/2016   Poor short term memory 09/18/2016    Seasonal and environmental allergies-txed by Dr Barnetta Chapel 09/18/2016   Upper respiratory symptom 09/18/2016   Abnormal brain MRI 12/05/2011   Borderline intellectual disability 12/05/2011   Neonatal seizures 12/05/2011   Strabismus 12/05/2011    Past Surgical History:  Procedure Laterality Date   EYE SURGERY     STRABISMUS SURGERY      OB History   No obstetric history on file.      Home Medications    Prior to Admission medications   Medication Sig Start Date End Date Taking? Authorizing Provider  amoxicillin-clavulanate (AUGMENTIN) 875-125 MG tablet Take 1 tablet by mouth every 12 (twelve) hours. 04/08/22  Yes Jannis Atkins, Acie Fredrickson, FNP  fluticasone (FLONASE) 50 MCG/ACT nasal spray Place 1 spray into both nostrils daily for 3 days. 04/08/22 04/11/22 Yes Tariya Morrissette, Acie Fredrickson, FNP  Cholecalciferol (VITAMIN D3) 125 MCG (5000 UT) TABS 5,000 IU OTC vitamin D3 daily. 04/21/19   Thomasene Lot, DO  desogestrel-ethinyl estradiol (APRI,EMOQUETTE,SOLIA) 0.15-30 MG-MCG tablet Take 1 tablet by mouth daily.    [provider]  fluticasone (FLONASE) 50 MCG/ACT nasal spray USE 1 SPRAY INTO BOTH NOSTRILS DAILY Patient taking differently: USE 1 SPRAY INTO BOTH NOSTRILS prn  11/23/19   Opalski, Gavin Pound, DO  levETIRAcetam (KEPPRA) 500 MG tablet Take 500 mg by mouth 2 (two) times daily. 11/16/20   [provider]  levocetirizine (XYZAL) 5 MG tablet Take 1 tablet (5 mg total) by mouth every evening. **NEEDS APT FOR FURTHER REFILLS** 08/17/20   Abonza, Maritza, PA-C  PULMICORT FLEXHALER 180 MCG/ACT inhaler INHALE 1 PUFF INTO THE LUNGS DAILY Patient taking differently: as needed. 09/03/19   Thomasene Lot, DO    Family History Family History  Problem Relation Age of  Onset   Cancer Maternal Grandmother        Uterin, skin   Depression Maternal Grandmother    Diabetes Maternal Grandmother    Hyperlipidemia Maternal Grandmother    Hypertension Maternal Grandmother    Diabetes Maternal Grandfather    Hyperlipidemia Maternal Grandfather    Hypertension Maternal Grandfather    Other Maternal Grandfather        agent orange   Depression Paternal Grandmother    Hyperlipidemia Paternal Grandfather    Hypertension Paternal Grandfather    Healthy Mother    Arrhythmia Mother    Healthy Father    Hyperlipidemia Father     Social History Social History   Tobacco Use   Smoking status: Never   Smokeless tobacco: Current  Vaping Use   Vaping Use: Every day   Start date: 11/28/2017   Substances: Nicotine, Flavoring  Substance Use Topics   Alcohol use: Yes    Comment: socially   Drug use: No     Allergies   Bevelyn Buckles, Other, Peanut-containing drug products, and Celery oil   Review of Systems Review of Systems Per HPI  Physical Exam Triage Vital Signs ED Triage Vitals [04/08/22 1946]  Enc Vitals Group     BP 108/75     Pulse Rate 93     Resp 18     Temp 98.1 F (36.7 C)     Temp Source Oral     SpO2 98 %     Weight      Height      Head Circumference      Peak Flow      Pain Score 0     Pain Loc      Pain Edu?      Excl. in GC?    No data found.  Updated Vital Signs BP 108/75   Pulse 93   Temp 98.1 F (36.7 C) (Oral)   Resp 18   LMP 04/01/2022 (Exact Date)   SpO2 98%   Visual Acuity Right Eye Distance:   Left Eye Distance:   Bilateral Distance:    Right Eye Near:   Left Eye Near:    Bilateral Near:     Physical Exam Constitutional:      General: She is not in acute distress.    Appearance: Normal appearance. She is not toxic-appearing or diaphoretic.  HENT:     Head: Normocephalic and atraumatic.     Right Ear: Tympanic membrane and ear canal normal.     Left Ear: Tympanic membrane and ear canal  normal.     Nose: Congestion present.     Right Sinus: Frontal sinus tenderness present. No maxillary sinus tenderness.     Left Sinus: Frontal sinus tenderness present. No maxillary sinus tenderness.     Mouth/Throat:     Mouth: Mucous membranes are moist.     Pharynx: No posterior oropharyngeal erythema.  Eyes:     Extraocular Movements: Extraocular movements  intact.     Conjunctiva/sclera: Conjunctivae normal.     Pupils: Pupils are equal, round, and reactive to light.  Cardiovascular:     Rate and Rhythm: Normal rate and regular rhythm.     Pulses: Normal pulses.     Heart sounds: Normal heart sounds.  Pulmonary:     Effort: Pulmonary effort is normal. No respiratory distress.     Breath sounds: Normal breath sounds. No stridor. No wheezing or rales.  Abdominal:     General: Abdomen is flat. Bowel sounds are normal.     Palpations: Abdomen is soft.  Musculoskeletal:        General: Normal range of motion.     Cervical back: Normal range of motion.  Skin:    General: Skin is warm and dry.  Neurological:     General: No focal deficit present.     Mental Status: She is alert and oriented to person, place, and time. Mental status is at baseline.  Psychiatric:        Mood and Affect: Mood normal.        Behavior: Behavior normal.      UC Treatments / Results  Labs (all labs ordered are listed, but only abnormal results are displayed) Labs Reviewed - No data to display  EKG   Radiology No results found.  Procedures Procedures (including critical care time)  Medications Ordered in UC Medications - No data to display  Initial Impression / Assessment and Plan / UC Course  I have reviewed the triage vital signs and the nursing notes.  Pertinent labs & imaging results that were available during my care of the patient were reviewed by me and considered in my medical decision making (see chart for details).     Differential diagnoses include viral upper respiratory  infection versus sinusitis.  Given significant sinus pressure will treat for sinusitis with Augmentin antibiotic.  Will avoid prednisone given patient's history of seizures and may lower seizure threshold.  Flonase prescribed for patient as well.  Patient was given strict return precautions.  Patient verbalized understanding and was agreeable with plan. Final Clinical Impressions(s) / UC Diagnoses   Final diagnoses:  Acute upper respiratory infection     Discharge Instructions      You have been prescribed antibiotic for your upper respiratory infection.  Please follow-up if symptoms persist or worsen.    ED Prescriptions     Medication Sig Dispense Auth. Provider   amoxicillin-clavulanate (AUGMENTIN) 875-125 MG tablet Take 1 tablet by mouth every 12 (twelve) hours. 14 tablet [place], [place], [place]   fluticasone [facility]) 50 MCG/ACT nasal spray Place 1 spray into both nostrils daily for 3 days. 16 g Gustavus Bryant, [place]      PDMP not reviewed this encounter.   Gustavus Bryant, [place] 04/08/22 2018

## 2022-05-27 ENCOUNTER — Ambulatory Visit
Admission: EM | Admit: 2022-05-27 | Discharge: 2022-05-27 | Disposition: A | Discharge status: Home / Self Care | Payer: 59 | Attending: Physician Assistant | Admitting: Physician Assistant

## 2022-05-27 ENCOUNTER — Encounter: Payer: Self-pay | Admitting: Physician Assistant

## 2022-05-27 ENCOUNTER — Other Ambulatory Visit: Payer: Self-pay

## 2022-05-27 DIAGNOSIS — R110 Nausea: Secondary | ICD-10-CM | POA: Diagnosis not present

## 2022-05-27 MED ORDER — ONDANSETRON 4 MG PO TBDP: 4.0000 mg | ORAL_TABLET | Freq: Three times a day (TID) | ORAL | 0 refills | Status: DC | PRN

## 2022-05-27 NOTE — ED Provider Notes (Signed)
[facility]    CSN: [id number] Arrival date & time: 05/27/22  1118      History   Chief Complaint Chief Complaint  Patient presents with   URI    HPI Kathleen Myers is a 26 y.o. female.   Patient here today for evaluation of nausea that started last night. She has not had any vomiting. She reports mild low grade fever last night. She has some nasal congestion at baseline due to allergies. She denies any abdominal pain, diarrhea.  The history is provided by the patient.    Past Medical History:  Diagnosis Date   Allergy    Asthma    as a child   Cortical visual impairment    Eczema    Poor short term memory 09/18/2016   Due to stroke/ sev spisodes of SZ- now has some diff memory, learning diff.   - white matter L side of frontal cortex   SAH (subarachnoid hemorrhage) (HCC)    Seizures (HCC)    Stroke [facility])    seizure/stroke    Patient Active Problem List   Diagnosis Date Noted   Encounter to establish care 12/10/2020   Need for Tdap vaccination 12/10/2020   Screening for tuberculosis 12/10/2020   Dysmenorrhea 07/22/2019   Vitamin D deficiency 04/21/2019   Abnormal laboratory test result- low A1c 04/21/2019   enlrged SCM muscle left side of neck- fullness just lat to L lobe of thyroid 04/21/2019   Left otitis media 12/29/2018   Dyspareunia in female 10/21/2018   Fever 08/10/2018   Headache, unspecified headache type 08/10/2018   Nausea without vomiting 08/10/2018   Abnormal MRI of head 06/02/2018   Underweight due to inadequate caloric intake 04/20/2018   Dizziness 04/20/2018   History of seizure 03/24/2018   Irregular periods 10/17/2017   Prolonged periods 10/17/2017   Dysmenorrhea in adolescent 10/17/2017   Family history of diabetes mellitus (DM) 10/17/2017   Reactive airway disease without complication 10/17/2017   Oral contraceptive use- seen by GYN Darlyn Chamber, NP 10/08/2016   Cortical visual impairment- age 70 09/18/2016   Seizure (HCC)  09/18/2016   Cerebrovascular accident (HCC) 09/18/2016   h/o SAH (subarachnoid hemorrhage) at birth 09/18/2016   Poor short term memory 09/18/2016    Seasonal and environmental allergies-txed by Dr Barnetta Chapel 09/18/2016   Upper respiratory symptom 09/18/2016   Abnormal brain MRI 12/05/2011   Borderline intellectual disability 12/05/2011   Neonatal seizures 12/05/2011   Strabismus 12/05/2011    Past Surgical History:  Procedure Laterality Date   EYE SURGERY     STRABISMUS SURGERY      OB History   No obstetric history on file.      Home Medications    Prior to Admission medications   Medication Sig Start Date End Date Taking? Authorizing Provider  ondansetron (ZOFRAN-ODT) 4 MG disintegrating tablet Take 1 tablet (4 mg total) by mouth every 8 (eight) hours as needed. 05/27/22  Yes Tomi Bamberger, PA-C  amoxicillin-clavulanate (AUGMENTIN) 875-125 MG tablet Take 1 tablet by mouth every 12 (twelve) hours. 04/08/22   Gustavus Bryant, FNP  Cholecalciferol (VITAMIN D3) 125 MCG (5000 UT) TABS 5,000 IU OTC vitamin D3 daily. 04/21/19   Thomasene Lot, DO  desogestrel-ethinyl estradiol (APRI,EMOQUETTE,SOLIA) 0.15-30 MG-MCG tablet Take 1 tablet by mouth daily.    [provider]  fluticasone (FLONASE) 50 MCG/ACT nasal spray USE 1 SPRAY INTO BOTH NOSTRILS DAILY Patient taking differently: USE 1 SPRAY INTO BOTH NOSTRILS prn 11/23/19  Opalski, Deborah, DO  fluticasone (FLONASE) 50 MCG/ACT nasal spray Place 1 spray into both nostrils daily for 3 days. 04/08/22 04/11/22  Gustavus Bryant, FNP  levETIRAcetam (KEPPRA) 500 MG tablet Take 500 mg by mouth 2 (two) times daily. 11/16/20   [provider]  levocetirizine (XYZAL) 5 MG tablet Take 1 tablet (5 mg total) by mouth every evening. **NEEDS APT FOR FURTHER REFILLS** 08/17/20   Abonza, Maritza, PA-C  PULMICORT FLEXHALER 180 MCG/ACT inhaler INHALE 1 PUFF INTO THE LUNGS DAILY Patient taking differently: as needed. 09/03/19   Thomasene Lot,  DO    Family History Family History  Problem Relation Age of Onset   Cancer Maternal Grandmother        Uterin, skin   Depression Maternal Grandmother    Diabetes Maternal Grandmother    Hyperlipidemia Maternal Grandmother    Hypertension Maternal Grandmother    Diabetes Maternal Grandfather    Hyperlipidemia Maternal Grandfather    Hypertension Maternal Grandfather    Other Maternal Grandfather        agent orange   Depression Paternal Grandmother    Hyperlipidemia Paternal Grandfather    Hypertension Paternal Grandfather    Healthy Mother    Arrhythmia Mother    Healthy Father    Hyperlipidemia Father     Social History Social History   Tobacco Use   Smoking status: Never   Smokeless tobacco: Current  Vaping Use   Vaping Use: Every day   Start date: 11/28/2017   Substances: Nicotine, Flavoring  Substance Use Topics   Alcohol use: Yes    Comment: socially   Drug use: No     Allergies   Justicia adhatoda, Other, Peanut-containing drug products, and Celery oil   Review of Systems Review of Systems  Constitutional:  Positive for fever (subjective). Negative for chills.  HENT:  Positive for congestion. Negative for ear pain and sore throat.   Eyes:  Negative for discharge and redness.  Respiratory:  Negative for cough, shortness of breath and wheezing.   Gastrointestinal:  Positive for nausea. Negative for abdominal pain, diarrhea and vomiting.     Physical Exam Triage Vital Signs ED Triage Vitals  Enc Vitals Group     BP      Pulse      Resp      Temp      Temp src      SpO2      Weight      Height      Head Circumference      Peak Flow      Pain Score      Pain Loc      Pain Edu?      Excl. in GC?    No data found.  Updated Vital Signs BP 95/72   Pulse 92   Temp 98 F (36.7 C) (Oral)   Resp 17   LMP 05/01/2022   SpO2 97%     Physical Exam Vitals and nursing note reviewed.  Constitutional:      General: She is not in acute  distress.    Appearance: Normal appearance. She is not ill-appearing.  HENT:     Head: Normocephalic and atraumatic.     Nose: Congestion present.  Eyes:     Conjunctiva/sclera: Conjunctivae normal.  Cardiovascular:     Rate and Rhythm: Normal rate and regular rhythm.     Heart sounds: Normal heart sounds. No murmur heard. Pulmonary:     Effort: Pulmonary effort  is normal. No respiratory distress.     Breath sounds: Normal breath sounds. No wheezing, rhonchi or rales.  Abdominal:     General: Abdomen is flat. Bowel sounds are normal. There is no distension.     Palpations: Abdomen is soft.     Tenderness: There is no abdominal tenderness. There is no guarding or rebound.  Skin:    General: Skin is warm and dry.  Neurological:     Mental Status: She is alert.  Psychiatric:        Mood and Affect: Mood normal.        Thought Content: Thought content normal.      UC Treatments / Results  Labs (all labs ordered are listed, but only abnormal results are displayed) Labs Reviewed - No data to display  EKG   Radiology No results found.  Procedures Procedures (including critical care time)  Medications Ordered in UC Medications - No data to display  Initial Impression / Assessment and Plan / UC Course  I have reviewed the triage vital signs and the nursing notes.  Pertinent labs & imaging results that were available during my care of the patient were reviewed by me and considered in my medical decision making (see chart for details).   Zofran prescribed for nausea.  Discussed possible viral etiology of symptoms and recommended follow-up if no gradual improvement.  Encouraged bland diet and increase fluids.   Final Clinical Impressions(s) / UC Diagnoses   Final diagnoses:  Nausea without vomiting   Discharge Instructions   None    ED Prescriptions     Medication Sig Dispense Auth. Provider   ondansetron (ZOFRAN-ODT) 4 MG disintegrating tablet Take 1 tablet (4 mg  total) by mouth every 8 (eight) hours as needed. 20 tablet Tomi Bamberger, PA-C      PDMP not reviewed this encounter.   Tomi Bamberger, PA-C 05/27/22 1310

## 2022-05-27 NOTE — ED Triage Notes (Signed)
Pt presents with sinus pain, bilateral ear pain, nausea, chills, and headache with reported fever at home since yesterday.

## 2022-12-21 ENCOUNTER — Ambulatory Visit
Admission: EM | Admit: 2022-12-21 | Discharge: 2022-12-21 | Disposition: A | Discharge status: Home / Self Care | Payer: 59

## 2022-12-21 ENCOUNTER — Ambulatory Visit (INDEPENDENT_AMBULATORY_CARE_PROVIDER_SITE_OTHER): Payer: 59

## 2022-12-21 DIAGNOSIS — M79645 Pain in left finger(s): Secondary | ICD-10-CM

## 2022-12-21 NOTE — ED Provider Notes (Signed)
[facility]    CSN: [id number] Arrival date & time: 12/21/22  1141      History   Chief Complaint Chief Complaint  Patient presents with   Finger Injury    Left index    HPI Kathleen Myers is a 27 y.o. female.   Patient presents with left index finger pain that started about 8 to 9 days ago.  Patient denies any obvious injury to the finger.  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  She has taken ibuprofen for pain with minimal improvement.  Has also been using a finger splint. Denies numbness or tingling.      Past Medical History:  Diagnosis Date   Allergy    Asthma    as a child   Cortical visual impairment    Eczema    Poor short term memory 09/18/2016   Due to stroke/ sev spisodes of SZ- now has some diff memory, learning diff.   - white matter L side of frontal cortex   SAH (subarachnoid hemorrhage) (HCC)    Seizures ([place])    Stroke [facility])    seizure/stroke    Patient Active Problem List   Diagnosis Date Noted   Encounter to establish care 12/10/2020   Need for Tdap vaccination 12/10/2020   Screening for tuberculosis 12/10/2020   Dysmenorrhea 07/22/2019   Vitamin D deficiency 04/21/2019   Abnormal laboratory test result- low A1c 04/21/2019   enlrged SCM muscle left side of neck- fullness just lat to L lobe of thyroid 04/21/2019   Left otitis media 12/29/2018   Dyspareunia in female 10/21/2018   Fever 08/10/2018   Headache, unspecified headache type 08/10/2018   Nausea without vomiting 08/10/2018   Abnormal MRI of head 06/02/2018   Underweight due to inadequate caloric intake 04/20/2018   Dizziness 04/20/2018   History of seizure 03/24/2018   Irregular periods 10/17/2017   Prolonged periods 10/17/2017   Dysmenorrhea in adolescent 10/17/2017   Family history of diabetes mellitus (DM) 10/17/2017   Reactive airway disease without complication  [id number]   Oral contraceptive use- seen by GYN Derry Lory, NP 10/08/2016   Cortical visual impairment- age 6 09/18/2016   Seizure ([place]) 09/18/2016   Cerebrovascular accident ([place]) 09/18/2016   h/o SAH (subarachnoid hemorrhage) at birth 09/18/2016   Poor short term memory 09/18/2016    Seasonal and environmental allergies-txed by Dr Orvil Feil 09/18/2016   Upper respiratory symptom 09/18/2016   Abnormal brain MRI 12/05/2011   Borderline intellectual disability 12/05/2011   Neonatal seizures 12/05/2011   Strabismus 12/05/2011    Past Surgical History:  Procedure Laterality Date   EYE SURGERY     STRABISMUS SURGERY      OB History   No obstetric history on file.      Home Medications    Prior to Admission medications   Medication Sig Start Date End Date Taking? Authorizing Provider  amoxicillin-clavulanate (AUGMENTIN) 875-125 MG tablet Take 1 tablet by mouth every 12 (twelve) hours. 04/08/22   Teodora Medici, FNP  Ascorbic Acid (VITAMIN C) 1000 MG tablet Take by mouth.    [provider]  Cholecalciferol (VITAMIN D3) 125 MCG (5000 UT) TABS 5,000 IU OTC vitamin D3 daily. 04/21/19   Mellody Dance, DO  desogestrel-ethinyl estradiol (APRI,EMOQUETTE,SOLIA) 0.15-30 MG-MCG tablet Take 1 tablet by mouth daily.    [provider]  fluticasone (FLONASE) 50 MCG/ACT nasal spray USE 1 SPRAY INTO BOTH NOSTRILS DAILY Patient taking differently: USE 1 SPRAY INTO BOTH NOSTRILS prn 11/23/19   Opalski, Deborah, DO  fluticasone (FLONASE) 50 MCG/ACT nasal spray Place 1 spray into both nostrils daily for 3 days. 04/08/22 04/11/22  Teodora Medici, FNP  levETIRAcetam (KEPPRA) 500 MG tablet Take 500 mg by mouth 2 (two) times daily. 11/16/20   [provider]  levocetirizine (XYZAL) 5 MG tablet Take 1 tablet (5 mg total) by mouth every evening. **NEEDS APT FOR FURTHER REFILLS** 08/17/20   Abonza, Maritza, PA-C  ondansetron (ZOFRAN-ODT) 4 MG disintegrating tablet Take 1 tablet (4 mg  total) by mouth every 8 (eight) hours as needed. 05/27/22   Francene Finders, PA-C  PULMICORT FLEXHALER 180 MCG/ACT inhaler INHALE 1 PUFF INTO THE LUNGS DAILY Patient taking differently: as needed. 09/03/19   Mellody Dance, DO    Family History Family History  Problem Relation Age of Onset   Cancer Maternal Grandmother        Uterin, skin   Depression Maternal Grandmother    Diabetes Maternal Grandmother    Hyperlipidemia Maternal Grandmother    Hypertension Maternal Grandmother    Diabetes Maternal Grandfather    Hyperlipidemia Maternal Grandfather    Hypertension Maternal Grandfather    Other Maternal Grandfather        agent orange   Depression Paternal Grandmother    Hyperlipidemia Paternal Grandfather    Hypertension Paternal Grandfather    Healthy Mother    Arrhythmia Mother    Healthy Father    Hyperlipidemia Father     Social History Social History   Tobacco Use   Smoking status: Never   Smokeless tobacco: Current  Vaping Use   Vaping Use: Every day   Start date: 11/28/2017   Substances: Nicotine, Flavoring  Substance Use Topics   Alcohol use: Yes    Comment: socially   Drug use: No     Allergies   Lenon Ahmadi, Other, Peanut-containing drug products, and Celery oil   Review of Systems Review of Systems Per HPI  Physical Exam Triage Vital Signs ED Triage Vitals  Enc Vitals Group     BP 12/21/22 1158 116/83     Pulse Rate 12/21/22 1158 83     Resp 12/21/22 1158 20     Temp 12/21/22 1158 97.9 F (36.6 C)     Temp Source 12/21/22 1158 Oral     SpO2 12/21/22 1158 98 %     Weight --      Height --      Head Circumference --      Peak Flow --      Pain Score 12/21/22 1219 6     Pain Loc --      Pain Edu? --      Excl. in [place]? --    No data found.  Updated Vital Signs BP 116/83 (BP Location: Left Arm)   Pulse 83   Temp 97.9 F (36.6 C) (Oral)   Resp 20   LMP 11/27/2022 (Approximate)   SpO2 98%   Visual Acuity Right Eye  Distance:   Left Eye Distance:   Bilateral Distance:    Right Eye Near:   Left Eye Near:    Bilateral Near:     Physical Exam Constitutional:      General: She is not in acute distress.    Appearance: Normal appearance. She is not toxic-appearing or diaphoretic.  HENT:  Head: Normocephalic and atraumatic.  Eyes:     Extraocular Movements: Extraocular movements intact.     Conjunctiva/sclera: Conjunctivae normal.  Pulmonary:     Effort: Pulmonary effort is normal.  Musculoskeletal:     Comments: Patient has tenderness to palpation from the PIP joint of the left second digit to the distal end of the finger.  There is no discoloration or swelling.  No lacerations or abrasions noted.  Capillary refill and pulses intact.  Limited range of motion of fingers due to pain.  Neurological:     General: No focal deficit present.     Mental Status: She is alert and oriented to person, place, and time. Mental status is at baseline.  Psychiatric:        Mood and Affect: Mood normal.        Behavior: Behavior normal.        Thought Content: Thought content normal.        Judgment: Judgment normal.      UC Treatments / Results  Labs (all labs ordered are listed, but only abnormal results are displayed) Labs Reviewed - No data to display  EKG   Radiology DG Finger Index Left  Result Date: 12/21/2022 CLINICAL DATA:  Finger pain. EXAM: LEFT INDEX FINGER 2+V COMPARISON:  None Available. FINDINGS: There is no evidence of fracture or dislocation. There is no evidence of arthropathy or other focal bone abnormality. No erosions or periostitis. Soft tissues are unremarkable. The included adjacent digits and hand are intact. IMPRESSION: Negative radiographs of the index finger. Electronically Signed   By: Keith Rake M.D.   On: 12/21/2022 12:43    Procedures Procedures (including critical care time)  Medications Ordered in UC Medications - No data to display  Initial Impression /  Assessment and Plan / UC Course  I have reviewed the triage vital signs and the nursing notes.  Pertinent labs & imaging results that were available during my care of the patient were reviewed by me and considered in my medical decision making (see chart for details).     X-ray was completed that showed no acute bony abnormality.  There are also no signs of infection or worrisome etiology on exam.  Suspect possible muscle strain/inflammation.  Advised safe over-the-counter pain relievers and supportive care.  Patient advised to follow-up with provided contact information for hand specialty if symptoms persist or worsen.  Patient verbalized understanding and was agreeable with plan. Final Clinical Impressions(s) / UC Diagnoses   Final diagnoses:  Finger pain, left     Discharge Instructions      X-ray was normal.  Follow-up with hand specialty if symptoms persist or worsen.  Recommend ice application.     ED Prescriptions   None    PDMP not reviewed this encounter.   Teodora Medici, [place] 12/21/22 1310

## 2022-12-21 NOTE — ED Triage Notes (Signed)
Patient presents to [facility] for left index finger pain since Thursday. Pt works in a daycare. No known injury. Not taking any OTC meds. Applied a finger split.

## 2022-12-21 NOTE — Discharge Instructions (Signed)
X-ray was normal.  Follow-up with hand specialty if symptoms persist or worsen.  Recommend ice application.

## 2023-01-30 ENCOUNTER — Ambulatory Visit
Admission: EM | Admit: 2023-01-30 | Discharge: 2023-01-30 | Disposition: A | Discharge status: Home / Self Care | Payer: 59 | Attending: Family Medicine | Admitting: Family Medicine

## 2023-01-30 DIAGNOSIS — R070 Pain in throat: Secondary | ICD-10-CM | POA: Insufficient documentation

## 2023-01-30 DIAGNOSIS — Z1152 Encounter for screening for COVID-19: Secondary | ICD-10-CM | POA: Insufficient documentation

## 2023-01-30 DIAGNOSIS — B349 Viral infection, unspecified: Secondary | ICD-10-CM | POA: Diagnosis not present

## 2023-01-30 LAB — POCT RAPID STREP A (OFFICE): Rapid Strep A Screen: NEGATIVE

## 2023-01-30 NOTE — ED Provider Notes (Signed)
[facility]    CSN: [id number] Arrival date & time: 01/30/23  1150      History   Chief Complaint Chief Complaint  Patient presents with   Chills   Fever   Headache    HPI Kathleen Myers is a 27 y.o. female.    Fever Associated symptoms: headaches   Headache Associated symptoms: fever    Here for fever and chills that began yesterday evening.  She is developing a sore throat and a headache today.  No cough and no congestion.  No nausea or vomiting or diarrhea.  She is not allergic to any medications.  Last menstrual cycle was April 13.  Past Medical History:  Diagnosis Date   Allergy    Asthma    as a child   Cortical visual impairment    Eczema    Poor short term memory 09/18/2016   Due to stroke/ sev spisodes of SZ- now has some diff memory, learning diff.   - white matter L side of frontal cortex   SAH (subarachnoid hemorrhage)    Seizures    Stroke    seizure/stroke    Patient Active Problem List   Diagnosis Date Noted   Encounter to establish care 12/10/2020   Need for Tdap vaccination 12/10/2020   Screening for tuberculosis 12/10/2020   Dysmenorrhea 07/22/2019   Vitamin D deficiency 04/21/2019   Abnormal laboratory test result- low A1c 04/21/2019   enlrged SCM muscle left side of neck- fullness just lat to L lobe of thyroid 04/21/2019   Left otitis media 12/29/2018   Dyspareunia in female 10/21/2018   Fever 08/10/2018   Headache, unspecified headache type 08/10/2018   Nausea without vomiting 08/10/2018   Abnormal MRI of head 06/02/2018   Underweight due to inadequate caloric intake 04/20/2018   Dizziness 04/20/2018   History of seizure 03/24/2018   Irregular periods 10/17/2017   Prolonged periods 10/17/2017   Dysmenorrhea in adolescent 10/17/2017   Family history of diabetes mellitus (DM) 10/17/2017   Reactive airway disease without complication 10/17/2017   Oral contraceptive use- seen by GYN Darlyn Chamber, NP 10/08/2016    Cortical visual impairment- age 13 09/18/2016   Seizure 09/18/2016   Cerebrovascular accident 09/18/2016   h/o SAH (subarachnoid hemorrhage) at birth 09/18/2016   Poor short term memory 09/18/2016    Seasonal and environmental allergies-txed by Dr Barnetta Chapel 09/18/2016   Upper respiratory symptom 09/18/2016   Abnormal brain MRI 12/05/2011   Borderline intellectual disability 12/05/2011   Neonatal seizures 12/05/2011   Strabismus 12/05/2011    Past Surgical History:  Procedure Laterality Date   EYE SURGERY     STRABISMUS SURGERY      OB History   No obstetric history on file.      Home Medications    Prior to Admission medications   Medication Sig Start Date End Date Taking? Authorizing Provider  Ascorbic Acid (VITAMIN C) 1000 MG tablet Take by mouth.    [provider]  Cholecalciferol (VITAMIN D3) 125 MCG (5000 UT) TABS 5,000 IU OTC vitamin D3 daily. 04/21/19   Thomasene Lot, DO  desogestrel-ethinyl estradiol (APRI,EMOQUETTE,SOLIA) 0.15-30 MG-MCG tablet Take 1 tablet by mouth daily.    [provider]  fluticasone (FLONASE) 50 MCG/ACT nasal spray USE 1 SPRAY INTO BOTH NOSTRILS DAILY Patient taking differently: USE 1 SPRAY INTO BOTH NOSTRILS prn 11/23/19   Opalski, Deborah, DO  fluticasone (FLONASE) 50 MCG/ACT nasal spray Place 1 spray into both nostrils daily for 3 days.  04/08/22 04/11/22  Gustavus Bryant, FNP  levETIRAcetam (KEPPRA) 500 MG tablet Take 500 mg by mouth 2 (two) times daily. 11/16/20   [provider]  levocetirizine (XYZAL) 5 MG tablet Take 1 tablet (5 mg total) by mouth every evening. **NEEDS APT FOR FURTHER REFILLS** 08/17/20   Abonza, Maritza, PA-C  PULMICORT FLEXHALER 180 MCG/ACT inhaler INHALE 1 PUFF INTO THE LUNGS DAILY Patient taking differently: as needed. 09/03/19   Thomasene Lot, DO    Family History Family History  Problem Relation Age of Onset   Cancer Maternal Grandmother        Uterin, skin   Depression Maternal Grandmother     Diabetes Maternal Grandmother    Hyperlipidemia Maternal Grandmother    Hypertension Maternal Grandmother    Diabetes Maternal Grandfather    Hyperlipidemia Maternal Grandfather    Hypertension Maternal Grandfather    Other Maternal Grandfather        agent orange   Depression Paternal Grandmother    Hyperlipidemia Paternal Grandfather    Hypertension Paternal Grandfather    Healthy Mother    Arrhythmia Mother    Healthy Father    Hyperlipidemia Father     Social History Social History   Tobacco Use   Smoking status: Never   Smokeless tobacco: Current  Vaping Use   Vaping Use: Every day   Start date: 11/28/2017   Substances: Nicotine, Flavoring  Substance Use Topics   Alcohol use: Yes    Comment: socially   Drug use: No     Allergies   Justicia adhatoda, Other, Peanut-containing drug products, and Celery oil   Review of Systems Review of Systems  Constitutional:  Positive for fever.  Neurological:  Positive for headaches.     Physical Exam Triage Vital Signs ED Triage Vitals  Enc Vitals Group     BP 01/30/23 1404 126/88     Pulse Rate 01/30/23 1404 (!) 110     Resp 01/30/23 1404 18     Temp 01/30/23 1404 (!) 100.5 F (38.1 C)     Temp Source 01/30/23 1404 Oral     SpO2 01/30/23 1404 96 %     Weight --      Height --      Head Circumference --      Peak Flow --      Pain Score 01/30/23 1406 7     Pain Loc --      Pain Edu? --      Excl. in [place]? --    No data found.  Updated Vital Signs BP 126/88 (BP Location: Left Arm)   Pulse (!) 110   Temp (!) 100.5 F (38.1 C) (Oral)   Resp 18   LMP 01/25/2023   SpO2 96%   Visual Acuity Right Eye Distance:   Left Eye Distance:   Bilateral Distance:    Right Eye Near:   Left Eye Near:    Bilateral Near:     Physical Exam Vitals reviewed.  Constitutional:      General: She is not in acute distress.    Appearance: She is not toxic-appearing.  HENT:     Right Ear: Tympanic membrane and ear  canal normal.     Left Ear: Tympanic membrane and ear canal normal.     Nose: Nose normal.     Mouth/Throat:     Mouth: Mucous membranes are moist.     Comments: There is erythema of the tonsillar pillars and the posterior oropharynx.  Clear mucus is draining.  There is 1+ tonsillar hypertrophy.  Eyes:     Extraocular Movements: Extraocular movements intact.     Conjunctiva/sclera: Conjunctivae normal.     Pupils: Pupils are equal, round, and reactive to light.  Cardiovascular:     Rate and Rhythm: Normal rate and regular rhythm.     Heart sounds: No murmur heard. Pulmonary:     Effort: Pulmonary effort is normal. No respiratory distress.     Breath sounds: No stridor. No wheezing, rhonchi or rales.  Musculoskeletal:     Cervical back: Neck supple.  Lymphadenopathy:     Cervical: No cervical adenopathy.  Skin:    Capillary Refill: Capillary refill takes less than 2 seconds.     Coloration: Skin is not jaundiced or pale.  Neurological:     General: No focal deficit present.     Mental Status: She is alert and oriented to person, place, and time.  Psychiatric:        Behavior: Behavior normal.      UC Treatments / Results  Labs (all labs ordered are listed, but only abnormal results are displayed) Labs Reviewed  CULTURE, GROUP A STREP ([facility])  SARS CORONAVIRUS 2 (TAT 6-24 HRS)  POCT RAPID STREP A (OFFICE)    EKG   Radiology No results found.  Procedures Procedures (including critical care time)  Medications Ordered in UC Medications - No data to display  Initial Impression / Assessment and Plan / UC Course  I have reviewed the triage vital signs and the nursing notes.  Pertinent labs & imaging results that were available during my care of the patient were reviewed by me and considered in my medical decision making (see chart for details).        Rapid strep is negative.  Throat culture is sent, and we will notify her and treat per protocol if that is in  turn positive  COVID swab was done today, and if positive she will know if she needs to quarantine   Final Clinical Impressions(s) / UC Diagnoses   Final diagnoses:  Viral illness  Throat pain     Discharge Instructions      Your strep test is negative.  Culture of the throat will be sent, and staff will notify you if that is in turn positive.   You have been swabbed for COVID, and the test will result in the next 24 hours. Our staff will call you if positive. If the COVID test is positive, you should quarantine until you are fever free for 24 hours and you are starting to feel better, and then take added precautions for the next 5 days, such as physical distancing/wearing a mask and good hand hygiene/washing.  Tylenol 500 mg--2 every 4 hours as needed for pain or fever or you can take ibuprofen 200 mg-2 every 6 hours as needed for pain or fever-     ED Prescriptions   None    PDMP not reviewed this encounter.   Zenia Resides, MD 01/30/23 1452

## 2023-01-30 NOTE — ED Triage Notes (Signed)
Pt presents with chills, fever, and headache since yesterday.

## 2023-01-30 NOTE — Discharge Instructions (Signed)
Your strep test is negative.  Culture of the throat will be sent, and staff will notify you if that is in turn positive.   You have been swabbed for COVID, and the test will result in the next 24 hours. Our staff will call you if positive. If the COVID test is positive, you should quarantine until you are fever free for 24 hours and you are starting to feel better, and then take added precautions for the next 5 days, such as physical distancing/wearing a mask and good hand hygiene/washing.  Tylenol 500 mg--2 every 4 hours as needed for pain or fever or you can take ibuprofen 200 mg-2 every 6 hours as needed for pain or fever-

## 2023-01-31 LAB — CULTURE, GROUP A STREP (THRC)

## 2023-01-31 LAB — SARS CORONAVIRUS 2 (TAT 6-24 HRS): SARS Coronavirus 2: NEGATIVE

## 2023-02-01 ENCOUNTER — Telehealth (HOSPITAL_COMMUNITY): Payer: Self-pay | Admitting: Physician Assistant

## 2023-02-01 ENCOUNTER — Encounter (HOSPITAL_COMMUNITY): Payer: Self-pay | Admitting: Physician Assistant

## 2023-02-01 MED ORDER — AMOXICILLIN 500 MG PO CAPS: 500.0000 mg | ORAL_CAPSULE | Freq: Two times a day (BID) | ORAL | 0 refills | Status: DC

## 2023-02-01 NOTE — Telephone Encounter (Signed)
Patient called back and we discussed positive results.  She reports that she is not using [facility] and requesting the medication be sent to [facility] on [address].  New prescription was sent per her request.  All questions answered to patient satisfaction.

## 2023-02-01 NOTE — Telephone Encounter (Signed)
Patient had moderate strep on throat culture.  Amoxicillin was sent to pharmacy.  Attempted to call patient to discuss results but left message for return call she did not answer.  MyChart message sent.

## 2023-02-02 ENCOUNTER — Telehealth (HOSPITAL_COMMUNITY): Payer: Self-pay | Admitting: Family Medicine

## 2023-02-02 NOTE — Telephone Encounter (Signed)
Tel enc opened to call pt about results, but pos throat c/s had already been managed by PA Raspet yesterday.

## 2023-04-14 ENCOUNTER — Emergency Department (HOSPITAL_BASED_OUTPATIENT_CLINIC_OR_DEPARTMENT_OTHER)
Admission: EM | Admit: 2023-04-14 | Discharge: 2023-04-15 | Disposition: A | Discharge status: Home / Self Care | Payer: 59 | Attending: Emergency Medicine | Admitting: Emergency Medicine

## 2023-04-14 ENCOUNTER — Other Ambulatory Visit: Payer: Self-pay

## 2023-04-14 DIAGNOSIS — R1030 Lower abdominal pain, unspecified: Secondary | ICD-10-CM | POA: Insufficient documentation

## 2023-04-14 DIAGNOSIS — R102 Pelvic and perineal pain: Secondary | ICD-10-CM

## 2023-04-14 DIAGNOSIS — R109 Unspecified abdominal pain: Secondary | ICD-10-CM | POA: Diagnosis present

## 2023-04-14 DIAGNOSIS — Z91010 Allergy to peanuts: Secondary | ICD-10-CM | POA: Insufficient documentation

## 2023-04-14 LAB — CBC
HCT: 38.3 % (ref 36.0–46.0)
Hemoglobin: 13 g/dL (ref 12.0–15.0)
MCH: 30.1 pg (ref 26.0–34.0)
MCHC: 33.9 g/dL (ref 30.0–36.0)
MCV: 88.7 fL (ref 80.0–100.0)
Platelets: 196 10*3/uL (ref 150–400)
RBC: 4.32 MIL/uL (ref 3.87–5.11)
RDW: 12.2 % (ref 11.5–15.5)
WBC: 10 10*3/uL (ref 4.0–10.5)
nRBC: 0 % (ref 0.0–0.2)

## 2023-04-14 LAB — COMPREHENSIVE METABOLIC PANEL
ALT: 14 U/L (ref 0–44)
AST: 19 U/L (ref 15–41)
Albumin: 4 g/dL (ref 3.5–5.0)
Alkaline Phosphatase: 48 U/L (ref 38–126)
Anion gap: 8 (ref 5–15)
BUN: 9 mg/dL (ref 6–20)
CO2: 22 mmol/L (ref 22–32)
Calcium: 8.9 mg/dL (ref 8.9–10.3)
Chloride: 104 mmol/L (ref 98–111)
Creatinine, Ser: 0.83 mg/dL (ref 0.44–1.00)
GFR, Estimated: >60 mL/min (ref >60)
Glucose, Bld: 126 mg/dL — ABNORMAL HIGH (ref 70–99)
Potassium: 3.6 mmol/L (ref 3.5–5.1)
Sodium: 134 mmol/L — ABNORMAL LOW (ref 135–145)
Total Bilirubin: 0.4 mg/dL (ref 0.3–1.2)
Total Protein: 7.2 g/dL (ref 6.5–8.1)

## 2023-04-14 LAB — URINALYSIS, MICROSCOPIC (REFLEX): RBC / HPF: NONE SEEN RBC/hpf (ref 0–5)

## 2023-04-14 LAB — URINALYSIS, ROUTINE W REFLEX MICROSCOPIC
Bilirubin Urine: NEGATIVE
Glucose, UA: NEGATIVE mg/dL
Hgb urine dipstick: NEGATIVE
Ketones, ur: NEGATIVE mg/dL
Nitrite: NEGATIVE
Protein, ur: NEGATIVE mg/dL
Specific Gravity, Urine: 1.02 (ref 1.005–1.030)
pH: 7 (ref 5.0–8.0)

## 2023-04-14 LAB — PREGNANCY, URINE: Preg Test, Ur: NEGATIVE

## 2023-04-14 LAB — LIPASE, BLOOD: Lipase: 42 U/L (ref 11–51)

## 2023-04-14 NOTE — ED Triage Notes (Signed)
POV from home, A&O x 4, GCS 15, amb to triage  Pt c/o midline lower abd pain that began approx 5pm while at work, denies associated symptoms of NVD.

## 2023-04-15 ENCOUNTER — Emergency Department (HOSPITAL_BASED_OUTPATIENT_CLINIC_OR_DEPARTMENT_OTHER): Payer: 59

## 2023-04-15 MED ORDER — KETOROLAC TROMETHAMINE 30 MG/ML IJ SOLN
30.0000 mg | Freq: Once | INTRAMUSCULAR | Status: AC
  Administered 2023-04-15: 30 mg via INTRAVENOUS
  Filled 2023-04-15: qty 1

## 2023-04-15 MED ORDER — TRAMADOL HCL 50 MG PO TABS: 50.0000 mg | ORAL_TABLET | Freq: Four times a day (QID) | ORAL | 0 refills | Status: DC | PRN

## 2023-04-15 MED ORDER — SODIUM CHLORIDE 0.9 % IV BOLUS
1000.0000 mL | Freq: Once | INTRAVENOUS | Status: AC
  Administered 2023-04-15: 1000 mL via INTRAVENOUS

## 2023-04-15 MED ORDER — IOHEXOL 300 MG/ML  SOLN
98.0000 mL | Freq: Once | INTRAMUSCULAR | Status: AC | PRN
  Administered 2023-04-15: 98 mL via INTRAVENOUS

## 2023-04-15 NOTE — ED Provider Notes (Signed)
[facility] Provider Note   CSN: [id number] Arrival date & time: 04/14/23  2123     History  Chief Complaint  Patient presents with   Abdominal Pain    Anjana Wisdom is a 27 y.o. female.  Patient is a 27 year old female with no significant past medical history.  Patient presenting today with complaints of abdominal pain.  This started earlier this evening and began in the absence of any injury or trauma.  She describes suprapubic discomfort that has been constant.  She denies any fevers or chills.  She denies any bowel or bladder complaints.  No vaginal bleeding or discharge.  There are no aggravating or alleviating factors.  The history is provided by the patient.       Home Medications Prior to Admission medications   Medication Sig Start Date End Date Taking? Authorizing Provider  amoxicillin (AMOXIL) 500 MG capsule Take 1 capsule (500 mg total) by mouth 2 (two) times daily. 02/01/23   Raspet, Noberto Retort, PA-C  Ascorbic Acid (VITAMIN C) 1000 MG tablet Take by mouth.    [provider]  Cholecalciferol (VITAMIN D3) 125 MCG (5000 UT) TABS 5,000 IU OTC vitamin D3 daily. 04/21/19   Thomasene Lot, DO  desogestrel-ethinyl estradiol (APRI,EMOQUETTE,SOLIA) 0.15-30 MG-MCG tablet Take 1 tablet by mouth daily.    [provider]  fluticasone (FLONASE) 50 MCG/ACT nasal spray USE 1 SPRAY INTO BOTH NOSTRILS DAILY Patient taking differently: USE 1 SPRAY INTO BOTH NOSTRILS prn 11/23/19   Opalski, Deborah, DO  fluticasone (FLONASE) 50 MCG/ACT nasal spray Place 1 spray into both nostrils daily for 3 days. 04/08/22 04/11/22  Gustavus Bryant, FNP  levETIRAcetam (KEPPRA) 500 MG tablet Take 500 mg by mouth 2 (two) times daily. 11/16/20   [provider]  levocetirizine (XYZAL) 5 MG tablet Take 1 tablet (5 mg total) by mouth every evening. **NEEDS APT FOR FURTHER REFILLS** 08/17/20   Abonza, Maritza, PA-C  PULMICORT FLEXHALER 180 MCG/ACT  inhaler INHALE 1 PUFF INTO THE LUNGS DAILY Patient taking differently: as needed. 09/03/19   Thomasene Lot, DO      Allergies    Bevelyn Buckles, Other, Peanut-containing drug products, and Celery oil    Review of Systems   Review of Systems  All other systems reviewed and are negative.   Physical Exam Updated Vital Signs BP 119/81 (BP Location: Right Arm)   Pulse (!) 102   Temp 99.7 F (37.6 C)   Resp 18   Ht 5\' 2"  (1.575 m)   Wt 44.5 kg   LMP 03/22/2023 (Exact Date)   SpO2 100%   BMI 17.92 kg/m  Physical Exam Vitals and nursing note reviewed.  Constitutional:      General: She is not in acute distress.    Appearance: She is well-developed. She is not diaphoretic.  HENT:     Head: Normocephalic and atraumatic.  Cardiovascular:     Rate and Rhythm: Normal rate and regular rhythm.     Heart sounds: No murmur heard.    No friction rub. No gallop.  Pulmonary:     Effort: Pulmonary effort is normal. No respiratory distress.     Breath sounds: Normal breath sounds. No wheezing.  Abdominal:     General: Bowel sounds are normal. There is no distension.     Palpations: Abdomen is soft.     Tenderness: There is abdominal tenderness in the suprapubic area. There is no right CVA tenderness, left CVA tenderness, guarding  or rebound.  Musculoskeletal:        General: Normal range of motion.     Cervical back: Normal range of motion and neck supple.  Skin:    General: Skin is warm and dry.  Neurological:     General: No focal deficit present.     Mental Status: She is alert and oriented to person, place, and time.     ED Results / Procedures / Treatments   Labs (all labs ordered are listed, but only abnormal results are displayed) Labs Reviewed  COMPREHENSIVE METABOLIC PANEL - Abnormal; Notable for the following components:      Result Value   Sodium 134 (*)    Glucose, Bld 126 (*)    All other components within normal limits  URINALYSIS, ROUTINE W REFLEX  MICROSCOPIC - Abnormal; Notable for the following components:   APPearance HAZY (*)    Leukocytes,Ua SMALL (*)    All other components within normal limits  URINALYSIS, MICROSCOPIC (REFLEX) - Abnormal; Notable for the following components:   Bacteria, UA RARE (*)    All other components within normal limits  LIPASE, BLOOD  CBC  PREGNANCY, URINE    EKG None  Radiology No results found.  Procedures Procedures    Medications Ordered in ED Medications  sodium chloride 0.9 % bolus 1,000 mL (has no administration in time range)  ketorolac (TORADOL) 30 MG/ML injection 30 mg (has no administration in time range)    ED Course/ Medical Decision Making/ A&P  Patient is a 27 year old female with no significant past medical history.  Patient presenting with suprapubic pain as described in the HPI.  Patient arrives here with stable vital signs and is afebrile.  Physical examination reveals mild tenderness to the suprapubic region, but no peritoneal signs.  Workup initiated including CBC, CMP, and lipase.  There is no leukocytosis, no elevation of lipase or LFTs, and no electrolyte derangement.  Urinalysis is basically clear and pregnancy test is negative.  CT scan of the abdomen and pelvis obtained showing no evidence for acute intra-abdominal process.  Patient has received IV Toradol and is feeling somewhat better.  At this point, the cause of her abdominal pain is unclear, but nothing appears emergent.  CT scan inconsistent with appendicitis or other acute intra-abdominal process.  Also considered is ovarian cyst or torsion, however this does not fit the clinical picture.  Patient to be discharged with ibuprofen and tramadol.  She is to follow-up with primary doctor if not improving and return to the ER if symptoms worsen.  Final Clinical Impression(s) / ED Diagnoses Final diagnoses:  None    Rx / DC Orders ED Discharge Orders     None         Geoffery Lyons, MD 04/15/23  0139

## 2023-04-15 NOTE — Discharge Instructions (Signed)
Begin taking ibuprofen 600 mg every 6 hours as needed for pain.  Begin taking tramadol as prescribed as needed for pain not relieved with ibuprofen.  Follow-up with primary doctor if not improving in the next few days, and return to the ER if you develop worsening pain, high fevers, bloody stools, or for other new and concerning symptoms.

## 2023-06-04 ENCOUNTER — Ambulatory Visit
Admission: EM | Admit: 2023-06-04 | Discharge: 2023-06-04 | Disposition: A | Discharge status: Home / Self Care | Payer: 59 | Attending: Physician Assistant | Admitting: Physician Assistant

## 2023-06-04 DIAGNOSIS — Z20822 Contact with and (suspected) exposure to covid-19: Secondary | ICD-10-CM | POA: Diagnosis not present

## 2023-06-04 DIAGNOSIS — U071 COVID-19: Secondary | ICD-10-CM | POA: Insufficient documentation

## 2023-06-04 LAB — POCT RAPID STREP A (OFFICE): Rapid Strep A Screen: NEGATIVE

## 2023-06-04 NOTE — ED Provider Notes (Signed)
[facility]    CSN: [id number] Arrival date & time: 06/04/23  1900      History   Chief Complaint Chief Complaint  Patient presents with   Sore Throat   COVID19 Testing    Exposure    HPI Kathleen Myers is a 27 y.o. female.   Patient here today for evaluation of sore throat.  She states she has had some cough and congestion as well.  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  She has not had fever.  She has not taken any medication for symptoms.  Symptoms started today.  The history is provided by the patient.  Sore Throat Pertinent negatives include no abdominal pain and no shortness of breath.    Past Medical History:  Diagnosis Date   Allergy    Asthma    as a child   Cortical visual impairment    Eczema    Poor short term memory 09/18/2016   Due to stroke/ sev spisodes of SZ- now has some diff memory, learning diff.   - white matter L side of frontal cortex   SAH (subarachnoid hemorrhage) (HCC)    Seizures (HCC)    Stroke [facility])    seizure/stroke    Patient Active Problem List   Diagnosis Date Noted   Encounter to establish care 12/10/2020   Need for Tdap vaccination 12/10/2020   Screening for tuberculosis 12/10/2020   Dysmenorrhea 07/22/2019   Vitamin D deficiency 04/21/2019   Abnormal laboratory test result- low A1c 04/21/2019   enlrged SCM muscle left side of neck- fullness just lat to L lobe of thyroid 04/21/2019   Left otitis media 12/29/2018   Dyspareunia in female 10/21/2018   Fever 08/10/2018   Headache, unspecified headache type 08/10/2018   Nausea without vomiting 08/10/2018   Abnormal MRI of head 06/02/2018   Underweight due to inadequate caloric intake 04/20/2018   Dizziness 04/20/2018   History of seizure 03/24/2018   Irregular periods 10/17/2017   Prolonged periods 10/17/2017   Dysmenorrhea in adolescent 10/17/2017   Family history of diabetes mellitus (DM) 10/17/2017   Reactive airway disease without complication 10/17/2017    Oral contraceptive use- seen by GYN Darlyn Chamber, NP 10/08/2016   Cortical visual impairment- age 59 09/18/2016   Seizure (HCC) 09/18/2016   Cerebrovascular accident (HCC) 09/18/2016   h/o SAH (subarachnoid hemorrhage) at birth 09/18/2016   Poor short term memory 09/18/2016    Seasonal and environmental allergies-txed by Dr Barnetta Chapel 09/18/2016   Upper respiratory symptom 09/18/2016   Abnormal brain MRI 12/05/2011   Borderline intellectual disability 12/05/2011   Neonatal seizures 12/05/2011   Strabismus 12/05/2011    Past Surgical History:  Procedure Laterality Date   EYE SURGERY     STRABISMUS SURGERY      OB History   No obstetric history on file.      Home Medications    Prior to Admission medications   Medication Sig Start Date End Date Taking? Authorizing Provider  Ascorbic Acid (VITAMIN C) 1000 MG tablet Take by mouth.   Yes [provider]  Cholecalciferol (VITAMIN D3) 125 MCG (5000 UT) TABS 5,000 IU OTC vitamin D3 daily. 04/21/19  Yes Opalski, Deborah, DO  fluticasone (FLONASE) 50 MCG/ACT nasal spray USE 1 SPRAY INTO BOTH NOSTRILS DAILY Patient taking differently: USE 1 SPRAY INTO BOTH NOSTRILS prn 11/23/19  Yes Opalski, Deborah, DO  levETIRAcetam (KEPPRA) 500 MG tablet Take 1,500 mg by mouth 2 (two) times daily. 11/16/20  Yes [provider]  levocetirizine (XYZAL) 5 MG tablet Take 1 tablet (5 mg total) by mouth every evening. **NEEDS APT FOR FURTHER REFILLS** 08/17/20  Yes Abonza, Maritza, PA-C  Norethindrone Acetate-Ethinyl Estradiol (HAILEY 1.5/30) 1.5-30 MG-MCG tablet Take 1 tablet by mouth daily.   Yes [provider]  amoxicillin (AMOXIL) 500 MG capsule Take 1 capsule (500 mg total) by mouth 2 (two) times daily. 02/01/23   Raspet, Noberto Retort, PA-C  desogestrel-ethinyl estradiol (APRI,EMOQUETTE,SOLIA) 0.15-30 MG-MCG tablet Take 1 tablet by mouth daily.    [provider]  fluticasone (FLONASE) 50 MCG/ACT nasal spray Place 1 spray into both  nostrils daily for 3 days. 04/08/22 04/11/22  Gustavus Bryant, FNP  PULMICORT FLEXHALER 180 MCG/ACT inhaler INHALE 1 PUFF INTO THE LUNGS DAILY Patient taking differently: as needed. 09/03/19   Opalski, Gavin Pound, DO  traMADol (ULTRAM) 50 MG tablet Take 1 tablet (50 mg total) by mouth every 6 (six) hours as needed. 04/15/23   Geoffery Lyons, MD    Family History Family History  Problem Relation Age of Onset   Cancer Maternal Grandmother        Uterin, skin   Depression Maternal Grandmother    Diabetes Maternal Grandmother    Hyperlipidemia Maternal Grandmother    Hypertension Maternal Grandmother    Diabetes Maternal Grandfather    Hyperlipidemia Maternal Grandfather    Hypertension Maternal Grandfather    Other Maternal Grandfather        agent orange   Depression Paternal Grandmother    Hyperlipidemia Paternal Grandfather    Hypertension Paternal Grandfather    Healthy Mother    Arrhythmia Mother    Healthy Father    Hyperlipidemia Father     Social History Social History   Tobacco Use   Smoking status: Never   Smokeless tobacco: Current  Vaping Use   Vaping status: Every Day   Start date: 11/28/2017   Substances: Nicotine, Flavoring  Substance Use Topics   Alcohol use: Yes    Comment: socially   Drug use: No     Allergies   Justicia adhatoda, Other, Peanut oil, Peanut-containing drug products, and Celery oil   Review of Systems Review of Systems  Constitutional:  Negative for chills and fever.  HENT:  Positive for congestion and sore throat. Negative for ear pain.   Eyes:  Negative for discharge and redness.  Respiratory:  Positive for cough. Negative for shortness of breath and wheezing.   Gastrointestinal:  Negative for abdominal pain, diarrhea, nausea and vomiting.     Physical Exam Triage Vital Signs ED Triage Vitals  Encounter Vitals Group     BP 06/04/23 1917 107/74     Systolic BP Percentile --      Diastolic BP Percentile --      Pulse Rate 06/04/23  1917 77     Resp 06/04/23 1917 16     Temp 06/04/23 1917 98 F (36.7 C)     Temp Source 06/04/23 1917 Oral     SpO2 06/04/23 1917 99 %     Weight 06/04/23 1912 100 lb (45.4 kg)     Height 06/04/23 1912 5\' 2"  (1.575 m)     Head Circumference --      Peak Flow --      Pain Score 06/04/23 1911 4     Pain Loc --      Pain Education --      Exclude from Growth Chart --    No data found.  Updated Vital Signs  BP 107/74 (BP Location: Right Arm)   Pulse 77   Temp 98 F (36.7 C) (Oral)   Resp 16   Ht 5\' 2"  (1.575 m)   Wt 100 lb (45.4 kg)   LMP 05/24/2023 (Exact Date)   SpO2 99%   BMI 18.29 kg/m     Physical Exam Vitals and nursing note reviewed.  Constitutional:      General: She is not in acute distress.    Appearance: Normal appearance. She is not ill-appearing.  HENT:     Head: Normocephalic and atraumatic.     Nose: Congestion present.     Mouth/Throat:     Mouth: Mucous membranes are moist.     Pharynx: No oropharyngeal exudate or posterior oropharyngeal erythema.  Eyes:     Conjunctiva/sclera: Conjunctivae normal.  Cardiovascular:     Rate and Rhythm: Normal rate and regular rhythm.     Heart sounds: Normal heart sounds. No murmur heard. Pulmonary:     Effort: Pulmonary effort is normal. No respiratory distress.     Breath sounds: Normal breath sounds. No wheezing, rhonchi or rales.  Skin:    General: Skin is warm and dry.  Neurological:     Mental Status: She is alert.  Psychiatric:        Mood and Affect: Mood normal.        Thought Content: Thought content normal.      UC Treatments / Results  Labs (all labs ordered are listed, but only abnormal results are displayed) Labs Reviewed  SARS CORONAVIRUS 2 (TAT 6-24 HRS)  POCT RAPID STREP A (OFFICE)    EKG   Radiology No results found.  Procedures Procedures (including critical care time)  Medications Ordered in UC Medications - No data to display  Initial Impression / Assessment and Plan /  UC Course  I have reviewed the triage vital signs and the nursing notes.  Pertinent labs & imaging results that were available during my care of the patient were reviewed by me and considered in my medical decision making (see chart for details).    COVID screening ordered.  Strep screening negative.  Will await COVID result for further recommendation but advised increased fluids, rest and symptomatic treatment if needed.  Final Clinical Impressions(s) / UC Diagnoses   Final diagnoses:  Exposure to COVID-19 virus   Discharge Instructions   None    ED Prescriptions   None    PDMP not reviewed this encounter.   Tomi Bamberger, PA-C 06/04/23 1944

## 2023-06-04 NOTE — ED Triage Notes (Signed)
"  I have a sore throat, it feel's like something is in there, hard to swallow". No fever. No new/unexplained rash.   Exposure from work [employer]) to [employer] and Strep.

## 2023-06-05 LAB — SARS CORONAVIRUS 2 (TAT 6-24 HRS): SARS Coronavirus 2: POSITIVE — AB

## 2023-06-06 LAB — URINE CULTURE: Culture: >=100000 — AB

## 2023-07-16 ENCOUNTER — Ambulatory Visit
Admission: EM | Admit: 2023-07-16 | Discharge: 2023-07-16 | Disposition: A | Discharge status: Home / Self Care | Payer: 59 | Attending: Internal Medicine | Admitting: Internal Medicine

## 2023-07-16 DIAGNOSIS — J029 Acute pharyngitis, unspecified: Secondary | ICD-10-CM

## 2023-07-16 LAB — POCT RAPID STREP A (OFFICE): Rapid Strep A Screen: NEGATIVE

## 2023-07-16 LAB — POCT INFLUENZA A/B
Influenza A, POC: NEGATIVE
Influenza B, POC: NEGATIVE

## 2023-07-16 MED ORDER — AMOXICILLIN-POT CLAVULANATE 875-125 MG PO TABS: 1.0000 | ORAL_TABLET | Freq: Two times a day (BID) | ORAL | 0 refills | Status: DC

## 2023-07-16 NOTE — ED Provider Notes (Signed)
[facility]    CSN: [id number] Arrival date & time: 07/16/23  1635      History   Chief Complaint Chief Complaint  Patient presents with   Sore Throat   Chills    HPI Renee Brewton is a 27 y.o. female.   The history is provided by the patient.  Sore Throat Associated symptoms include headaches.  Chills, sore throat abrupt onset today.  Admits subjective fever and fatigue.  Admits mild aches.  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  Had positive COVID test end of August.  His headache.  Nuys rhinorrhea, nasal congestion, chest pain, shortness of breath, nausea, vomiting, diarrhea, rash changes.  Past Medical History:  Diagnosis Date   Allergy    Asthma    as a child   Cortical visual impairment    Eczema    Poor short term memory 09/18/2016   Due to stroke/ sev spisodes of SZ- now has some diff memory, learning diff.   - white matter L side of frontal cortex   SAH (subarachnoid hemorrhage) (HCC)    Seizures (HCC)    Stroke [facility])    seizure/stroke    Patient Active Problem List   Diagnosis Date Noted   Encounter to establish care 12/10/2020   Need for Tdap vaccination 12/10/2020   Screening for tuberculosis 12/10/2020   Dysmenorrhea 07/22/2019   Vitamin D deficiency 04/21/2019   Abnormal laboratory test result- low A1c 04/21/2019   enlrged SCM muscle left side of neck- fullness just lat to L lobe of thyroid 04/21/2019   Left otitis media 12/29/2018   Dyspareunia in female 10/21/2018   Fever 08/10/2018   Headache, unspecified headache type 08/10/2018   Nausea without vomiting 08/10/2018   Abnormal MRI of head 06/02/2018   Underweight due to inadequate caloric intake 04/20/2018   Dizziness 04/20/2018   History of seizure 03/24/2018   Irregular periods 10/17/2017   Prolonged periods 10/17/2017   Dysmenorrhea in adolescent 10/17/2017   Family history of diabetes mellitus (DM) 10/17/2017   Reactive airway disease without  complication 10/17/2017   Oral contraceptive use- seen by GYN Darlyn Chamber, NP 10/08/2016   Cortical visual impairment- age 61 09/18/2016   Seizure (HCC) 09/18/2016   Cerebrovascular accident (HCC) 09/18/2016   h/o SAH (subarachnoid hemorrhage) at birth 09/18/2016   Poor short term memory 09/18/2016    Seasonal and environmental allergies-txed by Dr Barnetta Chapel 09/18/2016   Upper respiratory symptom 09/18/2016   Abnormal brain MRI 12/05/2011   Borderline intellectual disability 12/05/2011   Neonatal seizures 12/05/2011   Strabismus 12/05/2011    Past Surgical History:  Procedure Laterality Date   EYE SURGERY     STRABISMUS SURGERY      OB History   No obstetric history on file.      Home Medications    Prior to Admission medications   Medication Sig Start Date End Date Taking? Authorizing Provider  Ascorbic Acid (VITAMIN C) 1000 MG tablet Take by mouth.   Yes [provider]  Cholecalciferol (VITAMIN D3) 125 MCG (5000 UT) TABS 5,000 IU OTC vitamin D3 daily. 04/21/19  Yes Opalski, Deborah, DO  fluticasone (FLONASE) 50 MCG/ACT nasal spray USE 1 SPRAY INTO BOTH NOSTRILS DAILY Patient taking differently: USE 1 SPRAY INTO BOTH NOSTRILS prn 11/23/19  Yes Opalski, Deborah, DO  levETIRAcetam (KEPPRA) 500 MG tablet Take 1,500 mg by mouth 2 (two) times daily. 11/16/20  Yes [provider]  levocetirizine Elita Boone)  5 MG tablet Take 1 tablet (5 mg total) by mouth every evening. **NEEDS APT FOR FURTHER REFILLS** 08/17/20  Yes Abonza, Maritza, PA-C  Norethindrone Acetate-Ethinyl Estradiol (HAILEY 1.5/30) 1.5-30 MG-MCG tablet Take 1 tablet by mouth daily.   Yes [provider]  amoxicillin (AMOXIL) 500 MG capsule Take 1 capsule (500 mg total) by mouth 2 (two) times daily. 02/01/23   Raspet, Noberto Retort, PA-C  desogestrel-ethinyl estradiol (APRI,EMOQUETTE,SOLIA) 0.15-30 MG-MCG tablet Take 1 tablet by mouth daily.    [provider]  fluticasone (FLONASE) 50 MCG/ACT nasal spray  Place 1 spray into both nostrils daily for 3 days. 04/08/22 04/11/22  Gustavus Bryant, FNP  PULMICORT FLEXHALER 180 MCG/ACT inhaler INHALE 1 PUFF INTO THE LUNGS DAILY Patient taking differently: as needed. 09/03/19   Opalski, Gavin Pound, DO  traMADol (ULTRAM) 50 MG tablet Take 1 tablet (50 mg total) by mouth every 6 (six) hours as needed. 04/15/23   Geoffery Lyons, MD    Family History Family History  Problem Relation Age of Onset   Cancer Maternal Grandmother        Uterin, skin   Depression Maternal Grandmother    Diabetes Maternal Grandmother    Hyperlipidemia Maternal Grandmother    Hypertension Maternal Grandmother    Diabetes Maternal Grandfather    Hyperlipidemia Maternal Grandfather    Hypertension Maternal Grandfather    Other Maternal Grandfather        agent orange   Depression Paternal Grandmother    Hyperlipidemia Paternal Grandfather    Hypertension Paternal Grandfather    Healthy Mother    Arrhythmia Mother    Healthy Father    Hyperlipidemia Father     Social History Social History   Tobacco Use   Smoking status: Never   Smokeless tobacco: Current  Vaping Use   Vaping status: Every Day   Start date: 11/28/2017   Substances: Nicotine, Flavoring  Substance Use Topics   Alcohol use: Yes    Comment: socially   Drug use: No     Allergies   Justicia adhatoda, Other, Peanut oil, Peanut-containing drug products, and Celery oil   Review of Systems Review of Systems  Constitutional:  Positive for chills, diaphoresis, fatigue and fever. Negative for appetite change.  HENT:  Positive for sore throat.   Respiratory:  Negative for cough.   Gastrointestinal:  Negative for diarrhea, nausea and vomiting.  Skin:  Negative for rash.  Neurological:  Positive for headaches.     Physical Exam Triage Vital Signs ED Triage Vitals  Encounter Vitals Group     BP 07/16/23 1715 139/81     Systolic BP Percentile --      Diastolic BP Percentile --      Pulse Rate 07/16/23  1715 (!) 123     Resp 07/16/23 1715 20     Temp 07/16/23 1715 99.3 F (37.4 C)     Temp Source 07/16/23 1715 Oral     SpO2 07/16/23 1715 99 %     Weight 07/16/23 1717 95 lb (43.1 kg)     Height 07/16/23 1717 5\' 2"  (1.575 m)     Head Circumference --      Peak Flow --      Pain Score 07/16/23 1714 8     Pain Loc --      Pain Education --      Exclude from Growth Chart --    No data found.  Updated Vital Signs BP 139/81 (BP Location: Right Arm)  Pulse (!) 118   Temp 99.3 F (37.4 C) (Oral)   Resp 20   Ht 5\' 2"  (1.575 m)   Wt 95 lb (43.1 kg)   LMP  (LMP Unknown)   SpO2 99%   BMI 17.38 kg/m   Visual Acuity Right Eye Distance:   Left Eye Distance:   Bilateral Distance:    Right Eye Near:   Left Eye Near:    Bilateral Near:     Physical Exam Vitals and nursing note reviewed.  Constitutional:      Appearance: She is not ill-appearing.  HENT:     Head: Normocephalic.     Right Ear: Tympanic membrane and ear canal normal.     Left Ear: Tympanic membrane and ear canal normal.     Nose: No rhinorrhea.     Mouth/Throat:     Mouth: Mucous membranes are moist.     Pharynx: Uvula midline. Posterior oropharyngeal erythema present. No pharyngeal swelling or oropharyngeal exudate.     Tonsils: No tonsillar exudate.     Comments: Follow-up petechiae noted Yellow midline without swelling Cardiovascular:     Rate and Rhythm: Tachycardia present.  Pulmonary:     Effort: Pulmonary effort is normal.     Breath sounds: Normal breath sounds. No wheezing, rhonchi or rales.  Musculoskeletal:     Cervical back: Normal range of motion and neck supple.  Skin:    General: Skin is warm and dry.  Neurological:     Mental Status: She is alert.      UC Treatments / Results  Labs (all labs ordered are listed, but only abnormal results are displayed) Labs Reviewed  POCT RAPID STREP A (OFFICE)  POCT INFLUENZA A/B    EKG   Radiology No results  found.  Procedures Procedures (including critical care time)  Medications Ordered in UC Medications - No data to display  Initial Impression / Assessment and Plan / UC Course  I have reviewed the triage vital signs and the nursing notes.  Pertinent labs & imaging results that were available during my care of the patient were reviewed by me and considered in my medical decision making (see chart for details).     Abrupt onset of illness this afternoon recently exposed to COVID and strep, point-of-care strep was negative.  Has tachycardia and soft palate petechiae on exam, able to tolerate secretions no change in voice no evidence of peritonsillar abscess. Will check flu.  Rapid flu is negative will treat for strep based on exposure and physical findings. The meds for symptomatic relief, warning signs and follow-up discussed  Final Clinical Impressions(s) / UC Diagnoses   Final diagnoses:  None   Discharge Instructions   None    ED Prescriptions   None    PDMP not reviewed this encounter.   Meliton Rattan, [place] 07/16/23 1750

## 2023-07-16 NOTE — Discharge Instructions (Addendum)
Go to the emergency room for severe symptoms including inability to swallow, change in voice, high fever or concerns

## 2023-07-16 NOTE — ED Triage Notes (Signed)
Headache; Sore Throat; Chills -Entered by patient  +COVID19 on 06-05-2023. "I have a severe ha, feeling dizzy a little bit ago, hurt to swallow". No runny nose. No cough.

## 2023-07-22 ENCOUNTER — Ambulatory Visit
Admission: EM | Admit: 2023-07-22 | Discharge: 2023-07-22 | Disposition: A | Discharge status: Other Acute Inpatient Hospital | Payer: 59 | Attending: Physician Assistant | Admitting: Physician Assistant

## 2023-07-22 ENCOUNTER — Encounter (HOSPITAL_COMMUNITY): Payer: Self-pay | Admitting: Emergency Medicine

## 2023-07-22 ENCOUNTER — Emergency Department (HOSPITAL_COMMUNITY)
Admission: EM | Admit: 2023-07-22 | Discharge: 2023-07-23 | Disposition: A | Discharge status: Home / Self Care | Payer: 59 | Attending: Emergency Medicine | Admitting: Emergency Medicine

## 2023-07-22 ENCOUNTER — Other Ambulatory Visit: Payer: Self-pay

## 2023-07-22 DIAGNOSIS — R519 Headache, unspecified: Secondary | ICD-10-CM | POA: Insufficient documentation

## 2023-07-22 DIAGNOSIS — Z91010 Allergy to peanuts: Secondary | ICD-10-CM | POA: Insufficient documentation

## 2023-07-22 DIAGNOSIS — R824 Acetonuria: Secondary | ICD-10-CM | POA: Diagnosis not present

## 2023-07-22 DIAGNOSIS — E860 Dehydration: Secondary | ICD-10-CM | POA: Insufficient documentation

## 2023-07-22 DIAGNOSIS — Z20822 Contact with and (suspected) exposure to covid-19: Secondary | ICD-10-CM | POA: Insufficient documentation

## 2023-07-22 DIAGNOSIS — M545 Low back pain, unspecified: Secondary | ICD-10-CM | POA: Insufficient documentation

## 2023-07-22 DIAGNOSIS — R000 Tachycardia, unspecified: Secondary | ICD-10-CM | POA: Insufficient documentation

## 2023-07-22 DIAGNOSIS — R110 Nausea: Secondary | ICD-10-CM | POA: Diagnosis not present

## 2023-07-22 DIAGNOSIS — R630 Anorexia: Secondary | ICD-10-CM

## 2023-07-22 LAB — COMPREHENSIVE METABOLIC PANEL
ALT: 14 U/L (ref 0–44)
AST: 16 U/L (ref 15–41)
Albumin: 3.8 g/dL (ref 3.5–5.0)
Alkaline Phosphatase: 52 U/L (ref 38–126)
Anion gap: 10 (ref 5–15)
BUN: 6 mg/dL (ref 6–20)
CO2: 23 mmol/L (ref 22–32)
Calcium: 9.5 mg/dL (ref 8.9–10.3)
Chloride: 102 mmol/L (ref 98–111)
Creatinine, Ser: 0.89 mg/dL (ref 0.44–1.00)
GFR, Estimated: >60 mL/min (ref >60)
Glucose, Bld: 107 mg/dL — ABNORMAL HIGH (ref 70–99)
Potassium: 4 mmol/L (ref 3.5–5.1)
Sodium: 135 mmol/L (ref 135–145)
Total Bilirubin: 0.8 mg/dL (ref 0.3–1.2)
Total Protein: 7.2 g/dL (ref 6.5–8.1)

## 2023-07-22 LAB — RESP PANEL BY RT-PCR (RSV, FLU A&B, COVID)  RVPGX2
Influenza A by PCR: NEGATIVE
Influenza B by PCR: NEGATIVE
Resp Syncytial Virus by PCR: NEGATIVE
SARS Coronavirus 2 by RT PCR: NEGATIVE

## 2023-07-22 LAB — CBC
HCT: 40.7 % (ref 36.0–46.0)
Hemoglobin: 13.5 g/dL (ref 12.0–15.0)
MCH: 29.8 pg (ref 26.0–34.0)
MCHC: 33.2 g/dL (ref 30.0–36.0)
MCV: 89.8 fL (ref 80.0–100.0)
Platelets: 234 10*3/uL (ref 150–400)
RBC: 4.53 MIL/uL (ref 3.87–5.11)
RDW: 11.9 % (ref 11.5–15.5)
WBC: 13.8 10*3/uL — ABNORMAL HIGH (ref 4.0–10.5)
nRBC: 0 % (ref 0.0–0.2)

## 2023-07-22 LAB — POCT URINALYSIS DIP (MANUAL ENTRY)
Bilirubin, UA: NEGATIVE
Glucose, UA: NEGATIVE mg/dL
Leukocytes, UA: NEGATIVE
Nitrite, UA: NEGATIVE
Protein Ur, POC: NEGATIVE mg/dL
Spec Grav, UA: 1.01 (ref 1.010–1.025)
Urobilinogen, UA: 0.2 U/dL
pH, UA: 6.5 (ref 5.0–8.0)

## 2023-07-22 LAB — POCT INFLUENZA A/B
Influenza A, POC: NEGATIVE
Influenza B, POC: NEGATIVE

## 2023-07-22 LAB — URINALYSIS, ROUTINE W REFLEX MICROSCOPIC
Bilirubin Urine: NEGATIVE
Glucose, UA: NEGATIVE mg/dL
Hgb urine dipstick: NEGATIVE
Ketones, ur: 80 mg/dL — AB
Leukocytes,Ua: NEGATIVE
Nitrite: NEGATIVE
Protein, ur: NEGATIVE mg/dL
Specific Gravity, Urine: 1.017 (ref 1.005–1.030)
pH: 5 (ref 5.0–8.0)

## 2023-07-22 LAB — POCT FASTING CBG KUC MANUAL ENTRY: POCT Glucose (KUC): 95 mg/dL (ref 70–99)

## 2023-07-22 LAB — POCT MONO SCREEN (KUC): Mono, POC: NEGATIVE

## 2023-07-22 LAB — POCT URINE PREGNANCY: Preg Test, Ur: NEGATIVE

## 2023-07-22 LAB — LIPASE, BLOOD: Lipase: 26 U/L (ref 11–51)

## 2023-07-22 LAB — HCG, SERUM, QUALITATIVE: Preg, Serum: NEGATIVE

## 2023-07-22 NOTE — ED Provider Notes (Signed)
[facility]    CSN: [id number] Arrival date & time: 07/22/23  1601      History   Chief Complaint Chief Complaint  Patient presents with   Back Pain   Headache    HPI Kathleen Myers is a 27 y.o. female.   The history is provided by the patient.  Back Pain Associated symptoms: headaches   Associated symptoms: no abdominal pain, no dysuria and no fever   Headache Associated symptoms: back pain, nausea and sore throat   Associated symptoms: no abdominal pain, no fever and no vomiting     Past Medical History:  Diagnosis Date   Allergy    Asthma    as a child   Cortical visual impairment    Eczema    Poor short term memory 09/18/2016   Due to stroke/ sev spisodes of SZ- now has some diff memory, learning diff.   - white matter L side of frontal cortex   SAH (subarachnoid hemorrhage) (HCC)    Seizures (HCC)    Stroke [facility])    seizure/stroke    Patient Active Problem List   Diagnosis Date Noted   Encounter to establish care 12/10/2020   Need for Tdap vaccination 12/10/2020   Screening for tuberculosis 12/10/2020   Dysmenorrhea 07/22/2019   Vitamin D deficiency 04/21/2019   Abnormal laboratory test result- low A1c 04/21/2019   enlrged SCM muscle left side of neck- fullness just lat to L lobe of thyroid 04/21/2019   Left otitis media 12/29/2018   Dyspareunia in female 10/21/2018   Fever 08/10/2018   Headache, unspecified headache type 08/10/2018   Nausea without vomiting 08/10/2018   Abnormal MRI of head 06/02/2018   Underweight due to inadequate caloric intake 04/20/2018   Dizziness 04/20/2018   History of seizure 03/24/2018   Irregular periods 10/17/2017   Prolonged periods 10/17/2017   Dysmenorrhea in adolescent 10/17/2017   Family history of diabetes mellitus (DM) 10/17/2017   Reactive airway disease without complication 10/17/2017   Oral contraceptive use- seen by GYN Darlyn Chamber, NP 10/08/2016   Cortical visual impairment- age 63 09/18/2016    Seizure (HCC) 09/18/2016   Cerebrovascular accident (HCC) 09/18/2016   h/o SAH (subarachnoid hemorrhage) at birth 09/18/2016   Poor short term memory 09/18/2016    Seasonal and environmental allergies-txed by Dr Barnetta Chapel 09/18/2016   Upper respiratory symptom 09/18/2016   Abnormal brain MRI 12/05/2011   Borderline intellectual disability 12/05/2011   Neonatal seizures 12/05/2011   Strabismus 12/05/2011    Past Surgical History:  Procedure Laterality Date   EYE SURGERY     STRABISMUS SURGERY      OB History   No obstetric history on file.      Home Medications    Prior to Admission medications   Medication Sig Start Date End Date Taking? Authorizing Provider  amoxicillin-clavulanate (AUGMENTIN) 875-125 MG tablet Take 1 tablet by mouth every 12 (twelve) hours. 07/16/23  Yes Meliton Rattan, PA  Ascorbic Acid (VITAMIN C) 1000 MG tablet Take by mouth.   Yes [provider]  levETIRAcetam (KEPPRA) 500 MG tablet Take 1,500 mg by mouth 2 (two) times daily. 11/16/20  Yes [provider]  levocetirizine (XYZAL) 5 MG tablet Take 1 tablet (5 mg total) by mouth every evening. **NEEDS APT FOR FURTHER REFILLS** 08/17/20  Yes Abonza, Maritza, PA-C  Cholecalciferol (VITAMIN D3) 125 MCG (5000 UT) TABS 5,000 IU OTC vitamin D3 daily. 04/21/19   Thomasene Lot, DO  desogestrel-ethinyl estradiol (APRI,EMOQUETTE,SOLIA) 0.15-30  MG-MCG tablet Take 1 tablet by mouth daily.    [provider]  fluticasone (FLONASE) 50 MCG/ACT nasal spray USE 1 SPRAY INTO BOTH NOSTRILS DAILY Patient taking differently: USE 1 SPRAY INTO BOTH NOSTRILS prn 11/23/19   Opalski, Deborah, DO  fluticasone (FLONASE) 50 MCG/ACT nasal spray Place 1 spray into both nostrils daily for 3 days. 04/08/22 04/11/22  Gustavus Bryant, FNP  Norethindrone Acetate-Ethinyl Estradiol (HAILEY 1.5/30) 1.5-30 MG-MCG tablet Take 1 tablet by mouth daily.    [provider]  PULMICORT FLEXHALER 180 MCG/ACT inhaler INHALE 1  PUFF INTO THE LUNGS DAILY Patient taking differently: as needed. 09/03/19   Opalski, Gavin Pound, DO  traMADol (ULTRAM) 50 MG tablet Take 1 tablet (50 mg total) by mouth every 6 (six) hours as needed. 04/15/23   Geoffery Lyons, MD    Family History Family History  Problem Relation Age of Onset   Cancer Maternal Grandmother        Uterin, skin   Depression Maternal Grandmother    Diabetes Maternal Grandmother    Hyperlipidemia Maternal Grandmother    Hypertension Maternal Grandmother    Diabetes Maternal Grandfather    Hyperlipidemia Maternal Grandfather    Hypertension Maternal Grandfather    Other Maternal Grandfather        agent orange   Depression Paternal Grandmother    Hyperlipidemia Paternal Grandfather    Hypertension Paternal Grandfather    Healthy Mother    Arrhythmia Mother    Healthy Father    Hyperlipidemia Father     Social History Social History   Tobacco Use   Smoking status: Never   Smokeless tobacco: Never  Vaping Use   Vaping status: Every Day   Start date: 11/28/2017   Substances: Nicotine, Flavoring  Substance Use Topics   Alcohol use: Yes    Comment: socially   Drug use: No     Allergies   Justicia adhatoda, Other, Peanut oil, Peanut-containing drug products, and Celery oil   Review of Systems Review of Systems  Constitutional:  Positive for appetite change. Negative for chills and fever.  HENT:  Positive for sore throat.   Eyes:  Negative for discharge and redness.  Respiratory:  Negative for shortness of breath.   Gastrointestinal:  Positive for nausea. Negative for abdominal pain and vomiting.  Genitourinary:  Negative for dysuria and frequency.  Musculoskeletal:  Positive for back pain.  Neurological:  Positive for headaches.     Physical Exam Triage Vital Signs ED Triage Vitals  Encounter Vitals Group     BP 07/22/23 1749 121/87     Systolic BP Percentile --      Diastolic BP Percentile --      Pulse Rate 07/22/23 1749 92      Resp 07/22/23 1749 18     Temp 07/22/23 1749 99 F (37.2 C)     Temp Source 07/22/23 1749 Oral     SpO2 07/22/23 1749 99 %     Weight 07/22/23 1746 95 lb (43.1 kg)     Height 07/22/23 1746 5\' 2"  (1.575 m)     Head Circumference --      Peak Flow --      Pain Score 07/22/23 1743 7     Pain Loc --      Pain Education --      Exclude from Growth Chart --    No data found.  Updated Vital Signs BP 121/87 (BP Location: Left Arm)   Pulse 92   Temp  99 F (37.2 C) (Oral)   Resp 18   Ht 5\' 2"  (1.575 m)   Wt 95 lb (43.1 kg)   LMP 07/18/2023 (Exact Date)   SpO2 99%   BMI 17.38 kg/m      Physical Exam Vitals and nursing note reviewed.  Constitutional:      General: She is not in acute distress.    Appearance: Normal appearance. She is not ill-appearing.     Comments: Appears to not feel well  HENT:     Head: Normocephalic and atraumatic.  Eyes:     Conjunctiva/sclera: Conjunctivae normal.  Cardiovascular:     Rate and Rhythm: Normal rate.  Pulmonary:     Effort: Pulmonary effort is normal. No respiratory distress.  Neurological:     Mental Status: She is alert.  Psychiatric:        Mood and Affect: Mood normal.        Behavior: Behavior normal.        Thought Content: Thought content normal.      UC Treatments / Results  Labs (all labs ordered are listed, but only abnormal results are displayed) Labs Reviewed  POCT URINALYSIS DIP (MANUAL ENTRY) - Abnormal; Notable for the following components:      Result Value   Ketones, POC UA >= (160) (*)    Blood, UA trace-intact (*)    All other components within normal limits  POCT INFLUENZA A/B  POCT MONO SCREEN ([facility])  POCT URINE PREGNANCY  POCT FASTING CBG [facility] MANUAL ENTRY    EKG   Radiology No results found.  Procedures Procedures (including critical care time)  Medications Ordered in UC Medications - No data to display  Initial Impression / Assessment and Plan / UC Course  I have reviewed the triage vital  signs and the nursing notes.  Pertinent labs & imaging results that were available during my care of the patient were reviewed by me and considered in my medical decision making (see chart for details).   Patient with significantly elevated ketones in urine.  No other abnormal findings to explain symptoms.  Recommended further evaluation in the emergency room with stat labs and possible imaging, specifically given multiple comorbidities.  Patient is hesitant but discussed this with mother and shared decision making ultimately patient decides to go to emergency room and mother will transport via POV.   Final Clinical Impressions(s) / UC Diagnoses   Final diagnoses:  Ketonuria  Acute bilateral low back pain without sciatica  Decreased appetite  Nausea without vomiting   Discharge Instructions   None    ED Prescriptions   None    PDMP not reviewed this encounter.   Tomi Bamberger, PA-C 07/22/23 1928

## 2023-07-22 NOTE — ED Triage Notes (Addendum)
Went to UC and was told she has ketones in her urine. Denies hx of diabetes. Went in for headache and nausea since 8am this morning. Hx of seizures after subarachnoid hemorrhage at birth, reports med compliance and last seizure December 2022. Pt on Augmentin for strep that she was dx with last week.

## 2023-07-22 NOTE — ED Triage Notes (Signed)
Headache; Back Pain; Loss of Appetite - Entered by Patient.  "I was just here last week and I am on a antibiotic for strep, I am finishing that tomorrow". "My throat still hurts some, I am freezing, I can't get warm at all". "I was at work & now my lower back middle/right/left is hurting". "My mom thinks it is my kidney's". No fever.

## 2023-07-22 NOTE — ED Notes (Signed)
Patient is being discharged from the Urgent Care and sent to the Emergency Department via POV . Per RM, patient is in need of higher level of care due to back pain/large ketones in urine. Patient is aware and verbalizes understanding of plan of care.  Vitals:   07/22/23 1749  BP: 121/87  Pulse: 92  Resp: 18  Temp: 99 F (37.2 C)  SpO2: 99%

## 2023-07-23 NOTE — ED Provider Notes (Signed)
[facility] Provider Note   CSN: [id number] Arrival date & time: 07/22/23  1949     History  Chief Complaint  Patient presents with   Headache   Nausea    Kathleen Myers is a 27 y.o. female.  Referred to the emergency department for evaluation multiple problems.  Patient has been ill for about a week.  She was diagnosed with strep throat and has been on amoxicillin.  She reports that her throat started to improve after initiating the antibiotics.  Today, however, she had been feeling poorly again.  She developed a headache, back pain and presented to urgent care.  Patient had a urinalysis performed because of the back pain which did not show infection but there were ketones in her urine, she was referred to the ER for further evaluation.       Home Medications Prior to Admission medications   Medication Sig Start Date End Date Taking? Authorizing Provider  amoxicillin-clavulanate (AUGMENTIN) 875-125 MG tablet Take 1 tablet by mouth every 12 (twelve) hours. 07/16/23   Meliton Rattan, PA  Ascorbic Acid (VITAMIN C) 1000 MG tablet Take by mouth.    [provider]  Cholecalciferol (VITAMIN D3) 125 MCG (5000 UT) TABS 5,000 IU OTC vitamin D3 daily. 04/21/19   Thomasene Lot, DO  desogestrel-ethinyl estradiol (APRI,EMOQUETTE,SOLIA) 0.15-30 MG-MCG tablet Take 1 tablet by mouth daily.    [provider]  fluticasone (FLONASE) 50 MCG/ACT nasal spray USE 1 SPRAY INTO BOTH NOSTRILS DAILY Patient taking differently: USE 1 SPRAY INTO BOTH NOSTRILS prn 11/23/19   Opalski, Deborah, DO  fluticasone (FLONASE) 50 MCG/ACT nasal spray Place 1 spray into both nostrils daily for 3 days. 04/08/22 04/11/22  Gustavus Bryant, FNP  levETIRAcetam (KEPPRA) 500 MG tablet Take 1,500 mg by mouth 2 (two) times daily. 11/16/20   [provider]  levocetirizine (XYZAL) 5 MG tablet Take 1 tablet (5 mg total) by mouth every evening. **NEEDS APT FOR FURTHER  REFILLS** 08/17/20   Mayer Masker, PA-C  Norethindrone Acetate-Ethinyl Estradiol (HAILEY 1.5/30) 1.5-30 MG-MCG tablet Take 1 tablet by mouth daily.    [provider]  PULMICORT FLEXHALER 180 MCG/ACT inhaler INHALE 1 PUFF INTO THE LUNGS DAILY Patient taking differently: as needed. 09/03/19   Opalski, Gavin Pound, DO  traMADol (ULTRAM) 50 MG tablet Take 1 tablet (50 mg total) by mouth every 6 (six) hours as needed. 04/15/23   Geoffery Lyons, MD      Allergies    Bevelyn Buckles, Other, Peanut oil, Peanut-containing drug products, and Celery oil    Review of Systems   Review of Systems  Physical Exam Updated Vital Signs BP 120/81   Pulse (!) 121   Temp 98.5 F (36.9 C) (Oral)   Resp 16   Ht 5\' 2"  (1.575 m)   Wt 45.4 kg   LMP 07/18/2023 (Exact Date)   SpO2 99%   BMI 18.29 kg/m  Physical Exam Vitals and nursing note reviewed.  Constitutional:      General: She is not in acute distress.    Appearance: She is well-developed.  HENT:     Head: Normocephalic and atraumatic.     Mouth/Throat:     Mouth: Mucous membranes are moist.  Eyes:     General: Vision grossly intact. Gaze aligned appropriately.     Extraocular Movements: Extraocular movements intact.     Conjunctiva/sclera: Conjunctivae normal.  Cardiovascular:     Rate and Rhythm: Normal rate and regular rhythm.  Pulses: Normal pulses.     Heart sounds: Normal heart sounds, S1 normal and S2 normal. No murmur heard.    No friction rub. No gallop.  Pulmonary:     Effort: Pulmonary effort is normal. No respiratory distress.     Breath sounds: Normal breath sounds.  Abdominal:     General: Bowel sounds are normal.     Palpations: Abdomen is soft.     Tenderness: There is no abdominal tenderness. There is no guarding or rebound.     Hernia: No hernia is present.  Musculoskeletal:        General: No swelling.     Cervical back: Full passive range of motion without pain, normal range of motion and neck supple. No  spinous process tenderness or muscular tenderness. Normal range of motion.     Right lower leg: No edema.     Left lower leg: No edema.  Skin:    General: Skin is warm and dry.     Capillary Refill: Capillary refill takes less than 2 seconds.     Findings: No ecchymosis, erythema, rash or wound.  Neurological:     General: No focal deficit present.     Mental Status: She is alert and oriented to person, place, and time.     GCS: GCS eye subscore is 4. GCS verbal subscore is 5. GCS motor subscore is 6.     Cranial Nerves: Cranial nerves 2-12 are intact.     Sensory: Sensation is intact.     Motor: Motor function is intact.     Coordination: Coordination is intact.  Psychiatric:        Attention and Perception: Attention normal.        Mood and Affect: Mood normal.        Speech: Speech normal.        Behavior: Behavior normal.     ED Results / Procedures / Treatments   Labs (all labs ordered are listed, but only abnormal results are displayed) Labs Reviewed  COMPREHENSIVE METABOLIC PANEL - Abnormal; Notable for the following components:      Result Value   Glucose, Bld 107 (*)    All other components within normal limits  CBC - Abnormal; Notable for the following components:   WBC 13.8 (*)    All other components within normal limits  URINALYSIS, ROUTINE W REFLEX MICROSCOPIC - Abnormal; Notable for the following components:   Ketones, ur 80 (*)    All other components within normal limits  RESP PANEL BY RT-PCR (RSV, FLU A&B, COVID)  RVPGX2  LIPASE, BLOOD  HCG, SERUM, QUALITATIVE    EKG None  Radiology No results found.  Procedures Procedures    Medications Ordered in ED Medications - No data to display  ED Course/ Medical Decision Making/ A&P                                 Medical Decision Making Amount and/or Complexity of Data Reviewed Labs: ordered.   Patient appears well.  Oropharyngeal examination is clear, no signs of peritonsillar abscess.   Patient is appropriately on amoxicillin.  She developed headache, low back pain and low-grade fever earlier today.  Urinalysis does not suggest infection.  Ketones were 80 in the urine here.  I suspect that this is secondary to dehydration.  She has not a diabetic.  Sugar is not elevated.  No anion gap acidosis on workup.  Patient with mild tachycardia.  She has apparently had problems with this in the past and had been evaluated by cardiology that any findings.  I did find an echo performed in 2021 that was normal.  She also had a Holter monitor at that time that showed an average heart rate of 72 but heart rate varied from 50-1 41.  No atrial fibrillation, atrial flutter, SVT, VT noted.  Suspect her tachycardia here is related to dehydration.  Patient tolerating oral intake.  Due to long wait times in this ED, she has been in the waiting room for a number of hours.  She would prefer to go home and orally hydrate which I feel is appropriate.  Urine ketones are not significant.  Patient and mother reassured.        Final Clinical Impression(s) / ED Diagnoses Final diagnoses:  Dehydration    Rx / DC Orders ED Discharge Orders     None         Jazsmine Macari, Canary Brim, MD 07/23/23 [phone]

## 2023-07-23 NOTE — ED Notes (Signed)
Sent by urgent care due to ketones in urine.  Mother reports was worked up for mono covid UTI.  Patient has been on augmentin for a week for strep throat which has since resolved. Patient reports her heart rate fluctuants into the 200s but has had cardiac work up.

## 2023-08-19 DIAGNOSIS — I4711 Inappropriate sinus tachycardia, so stated: Secondary | ICD-10-CM | POA: Insufficient documentation

## 2023-12-23 ENCOUNTER — Ambulatory Visit: Admission: EM | Admit: 2023-12-23 | Discharge: 2023-12-23 | Discharge status: Against Medical Advice | Payer: Self-pay

## 2024-03-17 ENCOUNTER — Ambulatory Visit
Admission: EM | Admit: 2024-03-17 | Discharge: 2024-03-17 | Disposition: A | Discharge status: Home / Self Care | Attending: Family Medicine | Admitting: Family Medicine

## 2024-03-17 ENCOUNTER — Encounter: Payer: Self-pay | Admitting: Emergency Medicine

## 2024-03-17 DIAGNOSIS — H9201 Otalgia, right ear: Secondary | ICD-10-CM | POA: Diagnosis not present

## 2024-03-17 MED ORDER — AMOXICILLIN 875 MG PO TABS: 875.0000 mg | ORAL_TABLET | Freq: Two times a day (BID) | ORAL | 0 refills | Status: AC

## 2024-03-17 MED ORDER — PREDNISONE 20 MG PO TABS: 40.0000 mg | ORAL_TABLET | Freq: Every day | ORAL | 0 refills | Status: AC

## 2024-03-17 NOTE — Discharge Instructions (Signed)
 Make sure you're taking you allergy medicine every day.

## 2024-03-17 NOTE — ED Triage Notes (Signed)
 Pt reports R ear pain that started yesterday. Notes intermittent sharp, stabbing pains. No hearing changes. Pt also notes it is accompanied by postnasal drainage. Denies sore throat, fevers, and congestion. No relief with allergy meds or sudafed at home.

## 2024-03-17 NOTE — ED Provider Notes (Signed)
 [facility]   [id number] 03/17/24 Arrival Time: 0930  ASSESSMENT & PLAN:  1. Acute otalgia, right    Question early OM +/- eustachian tube dysfunction. Discussed. Begin: Meds ordered this encounter  Medications   amoxicillin  (AMOXIL ) 875 MG tablet    Sig: Take 1 tablet (875 mg total) by mouth 2 (two) times daily for 7 days.    Dispense:  14 tablet    Refill:  0   predniSONE (DELTASONE) 20 MG tablet    Sig: Take 2 tablets (40 mg total) by mouth daily.    Dispense:  10 tablet    Refill:  0  Work note provided.   Follow-up Information     [facility]).   Specialty: Urgent Care Why: As needed. Contact information: [address] [place]  [phone]                Reviewed expectations re: course of current medical issues. Questions answered. Outlined signs and symptoms indicating need for more acute intervention. Understanding verbalized. After Visit Summary given.   SUBJECTIVE: History from: Patient. Kathleen Myers is a 28 y.o. female. Pt reports R ear pain that started yesterday. Notes intermittent sharp, stabbing pains. No hearing changes. Pt also notes it is accompanied by postnasal drainage. Denies sore throat, fevers, and congestion. No relief with allergy meds or sudafed at home.  Denies: fever. Normal PO intake without n/v/d.  OBJECTIVE:  Vitals:   03/17/24 0950  BP: 118/87  Pulse: 95  Resp: 16  Temp: 98.1 F (36.7 C)  TempSrc: Oral  SpO2: 97%    General appearance: alert; no distress Eyes: PERRLA; EOMI; conjunctiva normal HENT: [place]; AT; with nasal congestion; R TM with bulging and opaque fluid behind; mild overlying superior erythema of R TM Neck: supple  Lungs: speaks full sentences without difficulty; unlabored Extremities: no edema Skin: warm and dry Neurologic: normal gait Psychological: alert and cooperative; normal mood and affect   Allergies   Allergen Reactions   Justicia Adhatoda Rash    anaphalaxis with peanuts Malabar Nut.    Other Rash and Other (See Comments)    anaphalaxis with peanuts  Other Allergies:   Adhatoda Vasica   Peanut Oil Anaphylaxis and Itching   Peanut-Containing Drug Products Anaphylaxis    Other Reaction(s): Not available   Celery Oil Other (See Comments) and Rash    Other Reaction(s): Not available  Other Reaction(s): Other (See Comments)    Past Medical History:  Diagnosis Date   Allergy    Asthma    as a child   Cortical visual impairment    Eczema    Poor short term memory 09/18/2016   Due to stroke/ sev spisodes of SZ- now has some diff memory, learning diff.   - white matter L side of frontal cortex   SAH (subarachnoid hemorrhage) (HCC)    Seizures (HCC)    Stroke (HCC)    seizure/stroke   Social History   Socioeconomic History   Marital status: Single    Spouse name: Not on file   Number of children: 0   Years of education: currently in college   Highest education level: Not on file  Occupational History   Occupation: [employer]  Tobacco Use   Smoking status: Never   Smokeless tobacco: Never  Vaping Use   Vaping status: Every Day   Start date: 11/28/2017   Substances: Nicotine, Flavoring  Substance and Sexual  Activity   Alcohol use: Yes    Comment: socially   Drug use: No   Sexual activity: Not Currently    Birth control/protection: Pill  Other Topics Concern   Not on file  Social History Narrative   Lives at home with parents and sister.   Left-handed.   2-3 cups caffeine per day.   Social Drivers of Corporate investment banker Strain: Low Risk  (11/26/2023)   Received from [facility]   Overall Financial Resource Strain (CARDIA)    Difficulty of Paying Living Expenses: Not very hard  Food Insecurity: No Food Insecurity (11/26/2023)   Received from [facility]   Hunger Vital Sign    Worried About Running Out of Food in the Last Year:  Never true    Ran Out of Food in the Last Year: Never true  Transportation Needs: No Transportation Needs (11/26/2023)   Received from [facility] - Transportation    Lack of Transportation (Medical): No    Lack of Transportation (Non-Medical): No  Physical Activity: Sufficiently Active (08/22/2021)   Received from [facility], [facility]   Exercise Vital Sign    Days of Exercise per Week: 5 days    Minutes of Exercise per Session: 30 min  Stress: No Stress Concern Present (08/22/2021)   Received from [facility], [facility] - Occupational Stress Questionnaire    Feeling of Stress : Not at all  Social Connections: Unknown (02/12/2022)   Received from [facility], [facility]   Social Network    Social Network: Not on file  Intimate Partner Violence: Unknown (01/14/2022)   Received from [facility], [facility]   HITS    Physically Hurt: Not on file    Insult or Talk Down To: Not on file    Threaten Physical Harm: Not on file    Scream or Curse: Not on file   Family History  Problem Relation Age of Onset   Cancer Maternal Grandmother        Uterin, skin   Depression Maternal Grandmother    Diabetes Maternal Grandmother    Hyperlipidemia Maternal Grandmother    Hypertension Maternal Grandmother    Diabetes Maternal Grandfather    Hyperlipidemia Maternal Grandfather    Hypertension Maternal Grandfather    Other Maternal Grandfather        agent orange   Depression Paternal Grandmother    Hyperlipidemia Paternal Grandfather    Hypertension Paternal Grandfather    Healthy Mother    Arrhythmia Mother    Healthy Father    Hyperlipidemia Father    Past Surgical History:  Procedure Laterality Date   EYE SURGERY     STRABISMUS SURGERY       [name], MD 03/17/24 1041

## 2024-04-28 ENCOUNTER — Ambulatory Visit
Admission: EM | Admit: 2024-04-28 | Discharge: 2024-04-28 | Disposition: A | Discharge status: Home / Self Care | Attending: Family Medicine | Admitting: Family Medicine

## 2024-04-28 ENCOUNTER — Encounter: Payer: Self-pay | Admitting: Emergency Medicine

## 2024-04-28 DIAGNOSIS — J069 Acute upper respiratory infection, unspecified: Secondary | ICD-10-CM | POA: Diagnosis not present

## 2024-04-28 MED ORDER — PROMETHAZINE-DM 6.25-15 MG/5ML PO SYRP: 5.0000 mL | ORAL_SOLUTION | Freq: Four times a day (QID) | ORAL | 0 refills | Status: AC | PRN

## 2024-04-28 NOTE — ED Triage Notes (Signed)
 Pt present c/o severe cough and chest congestion x 1 day. Pt denies any additional sxs.

## 2024-04-29 NOTE — ED Provider Notes (Signed)
 [facility]   [id number] 04/28/24 Arrival Time: 1910  ASSESSMENT & PLAN:  1. Viral URI with cough    Discussed typical duration of likely viral illness. OTC symptom care as needed.  Meds ordered this encounter  Medications   promethazine -dextromethorphan (PROMETHAZINE -DM) 6.25-15 MG/5ML syrup    Sig: Take 5 mLs by mouth 4 (four) times daily as needed for cough.    Dispense:  118 mL    Refill:  0   Work note provided.   Follow-up Information     [facility]).   Specialty: Urgent Care Why: If worsening or failing to improve as anticipated. Contact information: [address] [place]  [address] [phone]                Reviewed expectations re: course of current medical issues. Questions answered. Outlined signs and symptoms indicating need for more acute intervention. Understanding verbalized. After Visit Summary given.   SUBJECTIVE: History from: Patient. Kathleen Myers is a 28 y.o. female. Pt present c/o severe cough and chest congestion x 1 day. Pt denies any additional sxs.  Denies: fever. Normal PO intake without n/v/d.  OBJECTIVE:  Vitals:   04/28/24 1929 04/28/24 1930  BP:  122/80  Pulse:  84  Resp:  18  Temp:  98.5 F (36.9 C)  TempSrc:  Oral  SpO2:  98%  Weight: 45.4 kg   Height: 5' 2 (1.575 m)     General appearance: alert; no distress Eyes: PERRLA; EOMI; conjunctiva normal HENT: [place]; AT; with nasal congestion Neck: supple  Lungs: speaks full sentences without difficulty; unlabored; CTAB Extremities: no edema Skin: warm and dry Neurologic: normal gait Psychological: alert and cooperative; normal mood and affect  Labs:  Labs Reviewed - No data to display  Imaging: No results found.  Allergies  Allergen Reactions   Justicia Adhatoda Rash    anaphalaxis with peanuts Malabar Nut.    Other Rash and Other (See Comments)    anaphalaxis with  peanuts  Other Allergies:   Adhatoda Vasica   Peanut Oil Anaphylaxis and Itching   Peanut-Containing Drug Products Anaphylaxis    Other Reaction(s): Not available   Celery Oil Other (See Comments) and Rash    Other Reaction(s): Not available  Other Reaction(s): Other (See Comments)    Past Medical History:  Diagnosis Date   Allergy    Asthma    as a child   Cortical visual impairment    Eczema    Poor short term memory 09/18/2016   Due to stroke/ sev spisodes of SZ- now has some diff memory, learning diff.   - white matter L side of frontal cortex   SAH (subarachnoid hemorrhage) (HCC)    Seizures (HCC)    Stroke (HCC)    seizure/stroke   Social History   Socioeconomic History   Marital status: Single    Spouse name: Not on file   Number of children: 0   Years of education: currently in college   Highest education level: Not on file  Occupational History   Occupation: [employer]  Tobacco Use   Smoking status: Never    Passive exposure: Current   Smokeless tobacco: Never  Vaping Use   Vaping status: Every Day   Start date: 11/28/2017   Substances: Nicotine, Flavoring  Substance and Sexual Activity   Alcohol use: Yes    Comment: socially   Drug use: No   Sexual activity: Not  Currently    Birth control/protection: Pill  Other Topics Concern   Not on file  Social History Narrative   Lives at home with parents and sister.   Left-handed.   2-3 cups caffeine per day.   Social Drivers of Corporate investment banker Strain: Low Risk  (11/26/2023)   Received from [facility]   Overall Financial Resource Strain (CARDIA)    Difficulty of Paying Living Expenses: Not very hard  Food Insecurity: No Food Insecurity (11/26/2023)   Received from [facility]   Hunger Vital Sign    Within the past 12 months, you worried that your food would run out before you got the money to buy more.: Never true    Within the past 12 months, the food you bought  just didn't last and you didn't have money to get more.: Never true  Transportation Needs: No Transportation Needs (11/26/2023)   Received from [facility] - Transportation    Lack of Transportation (Medical): No    Lack of Transportation (Non-Medical): No  Physical Activity: Sufficiently Active (08/22/2021)   Received from [facility]   Exercise Vital Sign    On average, how many days per week do you engage in moderate to strenuous exercise (like a brisk walk)?: 5 days    On average, how many minutes do you engage in exercise at this level?: 30 min  Stress: No Stress Concern Present (08/22/2021)   Received from [facility] - Occupational Stress Questionnaire    Feeling of Stress : Not at all  Social Connections: Unknown (02/12/2022)   Received from [facility]   Social Network    Social Network: Not on file  Intimate Partner Violence: Unknown (01/14/2022)   Received from [facility]   HITS    Physically Hurt: Not on file    Insult or Talk Down To: Not on file    Threaten Physical Harm: Not on file    Scream or Curse: Not on file   Family History  Problem Relation Age of Onset   Cancer Maternal Grandmother        Uterin, skin   Depression Maternal Grandmother    Diabetes Maternal Grandmother    Hyperlipidemia Maternal Grandmother    Hypertension Maternal Grandmother    Diabetes Maternal Grandfather    Hyperlipidemia Maternal Grandfather    Hypertension Maternal Grandfather    Other Maternal Grandfather        agent orange   Depression Paternal Grandmother    Hyperlipidemia Paternal Grandfather    Hypertension Paternal Grandfather    Healthy Mother    Arrhythmia Mother    Healthy Father    Hyperlipidemia Father    Past Surgical History:  Procedure Laterality Date   EYE SURGERY     STRABISMUS SURGERY       Wadley, Redell, MD 04/29/24 [phone]

## 2024-07-19 ENCOUNTER — Ambulatory Visit (INDEPENDENT_AMBULATORY_CARE_PROVIDER_SITE_OTHER)

## 2024-07-19 ENCOUNTER — Ambulatory Visit: Admission: EM | Admit: 2024-07-19 | Discharge: 2024-07-19 | Disposition: A | Discharge status: Home / Self Care

## 2024-07-19 DIAGNOSIS — K5900 Constipation, unspecified: Secondary | ICD-10-CM

## 2024-07-19 DIAGNOSIS — Z3202 Encounter for pregnancy test, result negative: Secondary | ICD-10-CM | POA: Insufficient documentation

## 2024-07-19 DIAGNOSIS — R10A1 Flank pain, right side: Secondary | ICD-10-CM

## 2024-07-19 LAB — POCT URINE DIPSTICK
Bilirubin, UA: NEGATIVE
Blood, UA: NEGATIVE
Glucose, UA: NEGATIVE mg/dL
Ketones, POC UA: NEGATIVE mg/dL
Nitrite, UA: NEGATIVE
Spec Grav, UA: 1.02 (ref 1.010–1.025)
Urobilinogen, UA: 0.2 U/dL
pH, UA: 7.5 (ref 5.0–8.0)

## 2024-07-19 LAB — POCT URINE PREGNANCY: Preg Test, Ur: NEGATIVE

## 2024-07-19 MED ORDER — DICLOFENAC SODIUM 50 MG PO TBEC
50.0000 mg | DELAYED_RELEASE_TABLET | Freq: Two times a day (BID) | ORAL | 1 refills | Status: AC
Start: 2024-07-19 — End: ?

## 2024-07-19 MED ORDER — KETOROLAC TROMETHAMINE 30 MG/ML IJ SOLN
30.0000 mg | Freq: Once | INTRAMUSCULAR | Status: AC
  Administered 2024-07-19: 30 mg via INTRAMUSCULAR

## 2024-07-19 NOTE — Discharge Instructions (Signed)
  1. Acute right flank pain (Primary) - POCT URINE DIPSTICK completed in UC shows small leukocytes, no nitrite, no blood, these findings are not indicative of urinary tract infection or renal calculi. - POCT urine pregnancy completed in UC is negative - DG Abd 1 View x-ray completed in UC shows no acute sign of constipation or bowel obstruction. - Urine Culture collected in UC and sent to lab for further testing results should be available in 2 to 3 days. - ketorolac  (TORADOL ) 30 MG/ML injection 30 mg given in UC for acute right flank pain. - diclofenac  (VOLTAREN ) 50 MG EC tablet; Take 1 tablet (50 mg total) by mouth 2 (two) times daily.  Dispense: 30 tablet; Refill: 1 - If symptoms do not improve with prescribed oral anti-inflammatory pain medication follow-up in emergency department for further evaluation and management.

## 2024-07-19 NOTE — ED Triage Notes (Signed)
 Patient reports right flank pain staring yesterday (middle of the day). Stools abnormal (smaller amounts, hard ?). No dysuria, urgency or frequency reported. Reports taking Milk of Magnesia (last night) with little results. No fever. No nausea. No vomiting.

## 2024-07-19 NOTE — ED Provider Notes (Signed)
 [facility]  Note:  This document was prepared using Conservation officer, historic buildings and may include unintentional dictation errors.  MRN: [id number] DOB: 03/12/1996  Subjective:   Kathleen Myers is a 28 y.o. female presenting for acute onset right flank pain with radiation to right abdomen that started yesterday.  Patient concern for possible constipation, states that she had 1 bowel movement this morning which was abnormal stating that amount of stool was very small and hard.  Patient denies any dysuria, increased urinary frequency, urinary urgency.  Patient denies fever, nausea, vomiting, diarrhea.  Patient did take milk of mag last night with minimal improvement.  Patient denies any past history of kidney stones or severe constipation.  No current facility-administered medications for this encounter.  Current Outpatient Medications:    diclofenac  (VOLTAREN ) 50 MG EC tablet, Take 1 tablet (50 mg total) by mouth 2 (two) times daily., Disp: 30 tablet, Rfl: 1   levETIRAcetam (KEPPRA) 750 MG tablet, Take 1,500 mg by mouth 2 (two) times daily., Disp: , Rfl:    magnesium hydroxide (MILK OF MAGNESIA) 400 MG/5ML suspension, Take by mouth daily as needed for mild constipation., Disp: , Rfl:    amoxicillin -clavulanate (AUGMENTIN ) 875-125 MG tablet, Take 1 tablet by mouth every 12 (twelve) hours., Disp: , Rfl:    Budesonide  (PULMICORT  FLEXHALER) 90 MCG/ACT inhaler, Inhale into the lungs., Disp: , Rfl:    fluticasone  (FLONASE ) 50 MCG/ACT nasal spray, USE 1 SPRAY INTO BOTH NOSTRILS DAILY (Patient taking differently: USE 1 SPRAY INTO BOTH NOSTRILS prn), Disp: 16 g, Rfl: 5   HAILEY 24 FE 1-20 MG-MCG(24) tablet, Take 1 tablet by mouth daily., Disp: , Rfl:    levocetirizine (XYZAL ) 5 MG tablet, Take 1 tablet (5 mg total) by mouth every evening. **NEEDS APT FOR FURTHER REFILLS**, Disp: 60 tablet, Rfl: 0   predniSONE  (DELTASONE ) 20 MG tablet, Take 2 tablets (40 mg total) by mouth daily., Disp: 10  tablet, Rfl: 0   promethazine -dextromethorphan (PROMETHAZINE -DM) 6.25-15 MG/5ML syrup, Take 5 mLs by mouth 4 (four) times daily as needed for cough., Disp: 118 mL, Rfl: 0   traMADol  (ULTRAM ) 50 MG tablet, Take 1 tablet by mouth every 6 (six) hours as needed., Disp: , Rfl:    Allergies  Allergen Reactions   Justicia Adhatoda Rash    anaphalaxis with peanuts Malabar Nut.    Other Rash and Other (See Comments)    anaphalaxis with peanuts  Other Allergies:   Adhatoda Vasica   Peanut Oil Anaphylaxis and Itching   Peanut-Containing Drug Products Anaphylaxis    Other Reaction(s): Not available   Celery Oil Other (See Comments) and Rash    Other Reaction(s): Not available  Other Reaction(s): Other (See Comments)    Past Medical History:  Diagnosis Date   Allergy    Asthma    as a child   Cortical visual impairment    Eczema    Poor short term memory 09/18/2016   Due to stroke/ sev spisodes of SZ- now has some diff memory, learning diff.   - white matter L side of frontal cortex   SAH (subarachnoid hemorrhage) (HCC)    Seizures (HCC)    Stroke (HCC)    seizure/stroke     Past Surgical History:  Procedure Laterality Date   EYE SURGERY     STRABISMUS SURGERY      Family History  Problem Relation Age of Onset   Cancer Maternal Grandmother        Uterin, skin  Depression Maternal Grandmother    Diabetes Maternal Grandmother    Hyperlipidemia Maternal Grandmother    Hypertension Maternal Grandmother    Diabetes Maternal Grandfather    Hyperlipidemia Maternal Grandfather    Hypertension Maternal Grandfather    Other Maternal Grandfather        agent orange   Depression Paternal Grandmother    Hyperlipidemia Paternal Grandfather    Hypertension Paternal Grandfather    Healthy Mother    Arrhythmia Mother    Healthy Father    Hyperlipidemia Father     Social History   Tobacco Use   Smoking status: Never    Passive exposure: Current   Smokeless tobacco: Never   Vaping Use   Vaping status: Former   Start date: 11/28/2017   Substances: Nicotine, Flavoring  Substance Use Topics   Alcohol use: Yes    Comment: socially   Drug use: No    ROS Refer to HPI for ROS details.  Objective:   Vitals: BP 115/83 (BP Location: Left Arm)   Pulse 81   Temp (!) 97.5 F (36.4 C) (Oral)   Resp 18   Ht 5' 2 (1.575 m)   Wt 100 lb 1.4 oz (45.4 kg)   LMP  (LMP Unknown)   SpO2 99%   BMI 18.31 kg/m   Physical Exam Vitals and nursing note reviewed.  Constitutional:      General: She is not in acute distress.    Appearance: Normal appearance. She is well-developed. She is not ill-appearing or toxic-appearing.  HENT:     Head: Normocephalic and atraumatic.  Cardiovascular:     Rate and Rhythm: Normal rate.  Pulmonary:     Effort: Pulmonary effort is normal. No respiratory distress.     Breath sounds: No stridor. No wheezing.  Abdominal:     General: There is no distension.     Tenderness: There is abdominal tenderness. There is left CVA tenderness and rebound. There is no right CVA tenderness or guarding.  Skin:    General: Skin is warm and dry.  Neurological:     General: No focal deficit present.     Mental Status: She is alert and oriented to person, place, and time.  Psychiatric:        Mood and Affect: Mood normal.        Behavior: Behavior normal.     Procedures  Results for orders placed or performed during the hospital encounter of 07/19/24 (from the past 24 hours)  POCT urine pregnancy     Status: Normal   Collection Time: 07/19/24  8:41 AM  Result Value Ref Range   Preg Test, Ur Negative Negative  POCT URINE DIPSTICK     Status: Abnormal   Collection Time: 07/19/24  8:42 AM  Result Value Ref Range   Color, UA light yellow (A) yellow   Clarity, UA cloudy (A) clear   Glucose, UA negative negative mg/dL   Bilirubin, UA negative negative   Ketones, POC UA negative negative mg/dL   Spec Grav, UA 8.979 8.989 - 1.025   Blood, UA  negative negative   pH, UA 7.5 5.0 - 8.0   POC PROTEIN,UA trace negative, trace   Urobilinogen, UA 0.2 0.2 or 1.0 E.U./dL   Nitrite, UA Negative Negative   Leukocytes, UA Small (1+) (A) Negative    DG Abd 1 View Result Date: 07/19/2024 EXAM: 1 VIEW XRAY OF THE ABDOMEN 07/19/2024 08:51:08 AM COMPARISON: None available. CLINICAL HISTORY: Possible constipation. FINDINGS: BOWEL: Small  bowel. Decreased volume of stool throughout the colon compared to prior. Normal volume of stool in the rectosigmoid colon. No radiographic evidence of constipation. Improvement from comparison exam. Nonobstructive bowel gas pattern. SOFT TISSUES: No opaque urinary calculi. BONES: No acute osseous abnormality. IMPRESSION: 1. No radiographic evidence of constipation, with interval improvement from the prior exam. Electronically signed by: Norleen Boxer MD 07/19/2024 09:39 AM EDT RP Workstation: [id number]     Assessment and Plan :     Discharge Instructions       1. Acute right flank pain (Primary) - POCT URINE DIPSTICK completed in UC shows small leukocytes, no nitrite, no blood, these findings are not indicative of urinary tract infection or renal calculi. - POCT urine pregnancy completed in UC is negative - DG Abd 1 View x-ray completed in UC shows no acute sign of constipation or bowel obstruction. - Urine Culture collected in UC and sent to lab for further testing results should be available in 2 to 3 days. - ketorolac  (TORADOL ) 30 MG/ML injection 30 mg given in UC for acute right flank pain. - diclofenac  (VOLTAREN ) 50 MG EC tablet; Take 1 tablet (50 mg total) by mouth 2 (two) times daily.  Dispense: 30 tablet; Refill: 1 -Due to presentation of right upper quadrant abdominal pain with right flank pain, cholecystitis cannot be ruled out without advanced imaging.  Recommend follow-up in ER for further testing. - If symptoms do not improve with prescribed oral anti-inflammatory pain medication follow-up in  emergency department for further evaluation and management.       Shakela Donati B Gardy Montanari   Rogen Porte, [place], [place] 07/19/24 [phone]

## 2024-07-20 LAB — URINE CULTURE
Culture: NO GROWTH
Special Requests: NORMAL

## 2024-07-21 ENCOUNTER — Ambulatory Visit (HOSPITAL_COMMUNITY): Payer: Self-pay
